# Patient Record
Sex: Female | Born: 1955
Health system: Southern US, Community
[De-identification: ages and names within clinical notes are randomized; demographics above are authoritative.]

## PROBLEM LIST (undated history)

## (undated) DIAGNOSIS — E119 Type 2 diabetes mellitus without complications: Secondary | ICD-10-CM

## (undated) DIAGNOSIS — E079 Disorder of thyroid, unspecified: Secondary | ICD-10-CM

## (undated) DIAGNOSIS — C801 Malignant (primary) neoplasm, unspecified: Secondary | ICD-10-CM

## (undated) DIAGNOSIS — J309 Allergic rhinitis, unspecified: Secondary | ICD-10-CM

## (undated) DIAGNOSIS — K219 Gastro-esophageal reflux disease without esophagitis: Secondary | ICD-10-CM

## (undated) DIAGNOSIS — F419 Anxiety disorder, unspecified: Secondary | ICD-10-CM

## (undated) DIAGNOSIS — M1990 Unspecified osteoarthritis, unspecified site: Secondary | ICD-10-CM

## (undated) DIAGNOSIS — D649 Anemia, unspecified: Secondary | ICD-10-CM

## (undated) DIAGNOSIS — M4300 Spondylolysis, site unspecified: Secondary | ICD-10-CM

## (undated) DIAGNOSIS — E785 Hyperlipidemia, unspecified: Secondary | ICD-10-CM

## (undated) DIAGNOSIS — T7840XA Allergy, unspecified, initial encounter: Secondary | ICD-10-CM

## (undated) DIAGNOSIS — K561 Intussusception: Secondary | ICD-10-CM

## (undated) HISTORY — DX: Type 2 diabetes mellitus without complications: E11.9

## (undated) HISTORY — DX: Anemia, unspecified: D64.9

## (undated) HISTORY — DX: Malignant (primary) neoplasm, unspecified: C80.1

## (undated) HISTORY — DX: Unspecified osteoarthritis, unspecified site: M19.90

## (undated) HISTORY — PX: APPENDECTOMY: SHX54

## (undated) HISTORY — DX: Hyperlipidemia, unspecified: E78.5

## (undated) HISTORY — DX: Gastro-esophageal reflux disease without esophagitis: K21.9

## (undated) HISTORY — DX: Allergy, unspecified, initial encounter: T78.40XA

## (undated) HISTORY — PX: OTHER SURGICAL HISTORY: SHX169

## (undated) HISTORY — DX: Allergic rhinitis, unspecified: J30.9

## (undated) HISTORY — PX: COLONOSCOPY: SHX174

## (undated) HISTORY — DX: Disorder of thyroid, unspecified: E07.9

## (undated) HISTORY — DX: Intussusception: K56.1

## (undated) HISTORY — PX: UPPER GASTROINTESTINAL ENDOSCOPY: SHX188

## (undated) HISTORY — PX: WISDOM TOOTH EXTRACTION: SHX21

## (undated) HISTORY — DX: Spondylolysis, site unspecified: M43.00

## (undated) HISTORY — DX: Anxiety disorder, unspecified: F41.9

---

## 2001-10-18 HISTORY — PX: PERIPHERALLY INSERTED CENTRAL CATHETER INSERTION: SHX2221

## 2001-10-18 HISTORY — PX: SPINAL FUSION: SHX223

## 2014-10-25 ENCOUNTER — Institutional Professional Consult (permissible substitution): Payer: Self-pay | Admitting: Cardiovascular Disease

## 2014-11-22 ENCOUNTER — Encounter: Payer: Self-pay | Admitting: Cardiovascular Disease

## 2014-11-22 ENCOUNTER — Ambulatory Visit (INDEPENDENT_AMBULATORY_CARE_PROVIDER_SITE_OTHER): Payer: 59 | Admitting: Cardiovascular Disease

## 2014-11-22 VITALS — BP 90/84 | HR 60 | Ht 67.0 in | Wt 157.8 lb

## 2014-11-22 DIAGNOSIS — E785 Hyperlipidemia, unspecified: Secondary | ICD-10-CM | POA: Insufficient documentation

## 2014-11-22 DIAGNOSIS — R079 Chest pain, unspecified: Secondary | ICD-10-CM | POA: Insufficient documentation

## 2014-11-22 NOTE — Patient Instructions (Signed)
Your physician recommends that you continue on your current medications as directed. Please refer to the Current Medication list given to you today.  Your physician has requested that you have an exercise tolerance test. For further information please visit [email or web address]. Please also follow instruction sheet, as given.  Your physician recommends that you return for lab work in: the next 6 weeks for fasting cholesterol/liver/basic metabolic panel  Your physician recommends that you schedule a follow-up appointment in: 2-3 months with Dr. Acie Fredrickson

## 2014-11-22 NOTE — Progress Notes (Signed)
Cardiology Office Note   Date:  11/22/2014   ID:  Mary Andrade, DOB 1956/06/03, MRN [id number]  PCP:  No primary care provider on file.  Cardiologist:   Nahser, Wonda Cheng, MD   Chief Complaint  Patient presents with  . Chest Pain   1. Chest pain 2. DM - type 2 3. Hyperlipidemia    History of Present Illness: Mary Andrade is a 59 y.o. female  ( seen with husband [name]) who presents at the request of Dr. Elon Alas  for evaluation of hyperlipidemia and  chest pain  She presents with chest pain - " just a pain " .   Has been going on 2-3 months, has been stable for several months. She exercises regularly, works out with a Physiological scientist twice a week - cardio, strength training  .  She has some dizziness with these work outs but never has any CP or dyspnea.    CP occur With exertion and at rest . The pains can last all day long and others just last a seconds.  The CP has been associated with increased fatigue , no sweats, no dizziness   Also has GERD but this pain feels different  Tries to eat health foods  She was found to have hyperlipidemia at her last office visit and was started on Crestor.  She and her  Has been moved down from [place] last year. She's gained 22 pounds since that time.    Also smokes - 1 ppd.  Has quit once but restarted when she had back back surgery   Past Medical History  Diagnosis Date  . Diabetes mellitus without complication   . Hyperlipidemia     Past Surgical History  Procedure Laterality Date  . Spinal surgeries  2003, 2004, 2005     Current Outpatient Prescriptions  Medication Sig Dispense Refill  . ALPRAZolam (XANAX) 0.25 MG tablet Take 0.25 mg by mouth at bedtime as needed for anxiety.    . CRESTOR 5 MG tablet Take 5 mg by mouth daily.    Marland Kitchen ibuprofen (ADVIL,MOTRIN) 100 MG chewable tablet Chew 200 mg by mouth every 8 (eight) hours as needed for fever or mild pain.     . metFORMIN (GLUCOPHAGE) 500 MG tablet Take 500 mg by  mouth 2 (two) times daily.    Marland Kitchen NEXIUM 40 MG capsule Take 40 mg by mouth daily.    . Probiotic Product (PROBIOTIC DAILY PO) Take by mouth daily.     No current facility-administered medications for this visit.    Allergies:   Morphine and related    Social History:  The patient  reports that she has been smoking.  She does not have any smokeless tobacco history on file. She reports that she does not drink alcohol or use illicit drugs.   Family History:  The patient's family history includes Lung cancer in her father.    ROS:  Please see the history of present illness.    Review of Systems: Constitutional:  denies fever, chills, diaphoresis, appetite change and fatigue.  HEENT: denies photophobia, eye pain, redness, hearing loss, ear pain, congestion, sore throat, rhinorrhea, sneezing, neck pain, neck stiffness and tinnitus.  Respiratory: denies SOB, DOE, cough, chest tightness, and wheezing.  Cardiovascular: denies chest pain, palpitations and leg swelling.  Gastrointestinal: denies nausea, vomiting, abdominal pain, diarrhea, constipation, blood in stool.  Genitourinary: denies dysuria, urgency, frequency, hematuria, flank pain and difficulty urinating.  Musculoskeletal: denies  myalgias, back pain, joint  swelling, arthralgias and gait problem.   Skin: denies pallor, rash and wound.  Neurological: denies dizziness, seizures, syncope, weakness, light-headedness, numbness and headaches.   Hematological: denies adenopathy, easy bruising, personal or family bleeding history.  Psychiatric/ Behavioral: denies suicidal ideation, mood changes, confusion, nervousness, sleep disturbance and agitation.       All other systems are reviewed and negative.    PHYSICAL EXAM: VS:  BP 90/84 mmHg  Pulse 60  Ht 5\' 7"  (1.702 m)  Wt 157 lb 12.8 oz (71.578 kg)  BMI 24.71 kg/m2 , BMI Body mass index is 24.71 kg/(m^2). GEN: Well nourished, well developed, in no acute distress HEENT: normal Neck:  no JVD, carotid bruits, or masses Cardiac: RRR; no murmurs, rubs, or gallops,no edema  Respiratory:  clear to auscultation bilaterally, normal work of breathing GI: soft, nontender, nondistended, + BS MS: no deformity or atrophy Skin: warm and dry, no rash Neuro:  Strength and sensation are intact Psych: normal   EKG:  EKG is not ordered today. ECG from 10/14/14 - sinus bradycardia at 53.    Recent Labs: No results found for requested labs within last 365 days.    Lipid Panel No results found for: CHOL, TRIG, HDL, CHOLHDL, VLDL, LDLCALC, LDLDIRECT    Wt Readings from Last 3 Encounters:  11/22/14 157 lb 12.8 oz (71.578 kg)      Other studies Reviewed: Additional studies/ records that were reviewed today include: . Review of the above records demonstrates:    ASSESSMENT AND PLAN:  1.   Hyperlipidemia :   She had her recent lipids drawn at her medical doctor. Her total cholesterol was 231.   Her triglycerides are 34. A chill 71. Her LDL is 143. She was started on Crestor 20 mg a day. We'll recheck lipids in the proximal he 6 weeks.  2.  Chest discomfort : patient presents with some atypical chest pains she does have a history of cigarette smoking, hypertension, and hyperlipidemia. She works out regularly it typically does not pain but at other times she'll have very severe pains.   Resting EKG is normal. I think that she would benefit from a regular GXT stress test.  I'll see her back in the office in several months for follow-up visit.   Current medicines are reviewed at length with the patient today.  The patient does not have concerns regarding medicines.  The following changes have been made:  no change   Disposition:   FU with me in 2-3 months      Signed, Nahser, Wonda Cheng, MD  11/22/2014 4:18 PM    [facility] [place], [place], [place]  [address] Phone: [phone]; Fax: [phone]

## 2014-12-10 ENCOUNTER — Telehealth (HOSPITAL_COMMUNITY): Payer: Self-pay

## 2014-12-10 NOTE — Telephone Encounter (Signed)
Encounter complete. 

## 2014-12-12 ENCOUNTER — Ambulatory Visit (HOSPITAL_COMMUNITY)
Admission: RE | Admit: 2014-12-12 | Discharge: 2014-12-12 | Disposition: A | Discharge status: Home / Self Care | Payer: 59 | Source: Ambulatory Visit | Attending: Cardiovascular Disease | Admitting: Cardiovascular Disease

## 2014-12-12 DIAGNOSIS — R079 Chest pain, unspecified: Secondary | ICD-10-CM | POA: Diagnosis present

## 2014-12-12 NOTE — Procedures (Signed)
Exercise Treadmill Test  Pre-Exercise Testing Evaluation  NSR   Test  Exercise Tolerance Test Ordering MD: Mertie Moores, MD    Unique Test No: 1  Treadmill:  1  Indication for ETT: chest pain - rule out ischemia  Contraindication to ETT: No   Stress Modality: exercise - treadmill  Cardiac Imaging Performed: non   Protocol: standard Bruce - maximal  Max BP:  138/102  Max MPHR (bpm):  164 85% MPR (bpm):  139  MPHR obtained (bpm):  160 % MPHR obtained:  97  Reached 85% MPHR (min:sec):  4:35 Total Exercise Time (min-sec):  9  Workload in METS:  10.1 Borg Scale: 16  Reason ETT Terminated:  SOB and Back Pain    ST Segment Analysis At Rest: normal ST segments - no evidence of significant ST depression With Exercise: no evidence of significant ST depression  Other Information Arrhythmia:  No Angina during ETT:  absent (0) Quality of ETT:  diagnostic  ETT Interpretation:  normal - no evidence of ischemia by ST analysis  Comments: Increase in diastolic BP during exercise. Good exercise tolerance.  [name], MD, [facility] [phone] office [phone] pager

## 2015-01-03 ENCOUNTER — Other Ambulatory Visit: Payer: 59

## 2015-01-24 ENCOUNTER — Other Ambulatory Visit: Payer: Self-pay | Admitting: Family Medicine

## 2015-01-24 ENCOUNTER — Ambulatory Visit
Admission: RE | Admit: 2015-01-24 | Discharge: 2015-01-24 | Disposition: A | Discharge status: Home / Self Care | Payer: 59 | Source: Ambulatory Visit | Attending: Family Medicine | Admitting: Family Medicine

## 2015-01-24 DIAGNOSIS — R059 Cough, unspecified: Secondary | ICD-10-CM

## 2015-01-24 DIAGNOSIS — R05 Cough: Secondary | ICD-10-CM

## 2015-02-19 ENCOUNTER — Telehealth: Payer: Self-pay | Admitting: Cardiovascular Disease

## 2015-02-19 NOTE — Telephone Encounter (Signed)
Spoke with patient who states she never received a call regarding her GXT results.  I reviewed these with the patient and apologized that she never got an official call from [place] (she was told by the technician at the time of the test that she did well and that the official report would be called to her later).  Patient reports that she is doing well; states she thinks the chest pain is related to stress and acid reflux.  She was advised to f/u with Dr. Acie Fredrickson in 2-3 months when she was initially seen in February 2016.  I advised patient to call back with questions or concerns and for future follow-up as needed.  Patient verbalized understanding and agreement.

## 2015-02-19 NOTE — Telephone Encounter (Signed)
NEw Message  Pt requested to speak w/ Rn about stress test results. Please cal back and discuss.

## 2015-02-20 ENCOUNTER — Ambulatory Visit: Payer: 59 | Admitting: Cardiovascular Disease

## 2015-02-25 ENCOUNTER — Other Ambulatory Visit: Payer: Self-pay | Admitting: Family Medicine

## 2015-02-25 ENCOUNTER — Ambulatory Visit
Admission: RE | Admit: 2015-02-25 | Discharge: 2015-02-25 | Disposition: A | Discharge status: Home / Self Care | Payer: 59 | Source: Ambulatory Visit | Attending: Family Medicine | Admitting: Family Medicine

## 2015-02-25 DIAGNOSIS — M25512 Pain in left shoulder: Secondary | ICD-10-CM

## 2015-09-07 NOTE — Progress Notes (Signed)
Cardiology Office Note   Date:  09/07/2015   ID:  [name], DOB 04-Apr-1956, MRN [id number]  PCP:  No primary care provider on file.  Cardiologist:   Harim Bi, Wonda Cheng, MD   Chief Complaint  Patient presents with  . Chest Pain   1. Chest pain 2. DM - type 2 3. Hyperlipidemia    History of Present Illness: Mary Andrade is a 59 y.o. female  ( seen with husband Mary Andrade) who presents at the request of Dr. Elon Alas  for evaluation of hyperlipidemia and  chest pain  She presents with chest pain - " just a pain " .   Has been going on 2-3 months, has been stable for several months. She exercises regularly, works out with a Physiological scientist twice a week - cardio, strength training  .  She has some dizziness with these work outs but never has any CP or dyspnea.    CP occur With exertion and at rest . The pains can last all day long and others just last a seconds.  The CP has been associated with increased fatigue , no sweats, no dizziness   Also has GERD but this pain feels different  Tries to eat health foods  She was found to have hyperlipidemia at her last office visit and was started on Crestor.  She and her  Has been moved down from [place] last year. She's gained 22 pounds since that time.    Also smokes - 1 ppd.  Has quit once but restarted when she had back back surgery  Nov. 20, 2016:  Mary Andrade had a stress test after our initial visit   standard Bruce - maximal Max BP: 138/102  Max MPHR (bpm): 164 85% MPR (bpm): 139  MPHR obtained (bpm): 160 % MPHR obtained: 97  Reached 85% MPHR (min:sec): 4:35 Total Exercise Time (min-sec):  9  Workload in METS: 10.1 Borg Scale: 16   At Rest: normal ST segments - no evidence of significant ST  depression With Exercise: no evidence of significant ST depression   ETT Interpretation: normal - no evidence of ischemia   Nov. 21, 2016: Has done well. Had some unusual chest pains  While visiting [place]  looked ok ( according to patient )  Has now been diagnosed with GERD  Has been cutting back on her smoking .   Still has occasional episodes of CP. Hurts constantly ( in fact hurts right now)     Past Medical History  Diagnosis Date  . Diabetes mellitus without complication   . Hyperlipidemia     Past Surgical History  Procedure Laterality Date  . Spinal surgeries  2003, 2004, 2005     Current Outpatient Prescriptions  Medication Sig Dispense Refill  . ALPRAZolam (XANAX) 0.25 MG tablet Take 0.25 mg by mouth at bedtime as needed for anxiety.    . CRESTOR 5 MG tablet Take 5 mg by mouth daily.    Marland Kitchen ibuprofen (ADVIL,MOTRIN) 100 MG chewable tablet Chew 200 mg by mouth every 8 (eight) hours as needed for fever or mild pain.     . metFORMIN (GLUCOPHAGE) 500 MG tablet Take 500 mg by mouth 2 (two) times daily.    Marland Kitchen NEXIUM 40 MG capsule Take 40 mg by mouth daily.    . Probiotic Product (PROBIOTIC DAILY PO) Take by mouth daily.     No current facility-administered medications for this visit.    Allergies:   Morphine and related  Social History:  The patient  reports that she has been smoking.  She does not have any smokeless tobacco history on file. She reports that she does not drink alcohol or use illicit drugs.   Family History:  The patient's family history includes Lung cancer in her father.    ROS:  Please see the history of present illness.    Review of Systems: Constitutional:  denies fever, chills, diaphoresis, appetite change and fatigue.  HEENT: denies photophobia, eye pain, redness, hearing loss, ear pain, congestion, sore throat, rhinorrhea, sneezing, neck pain, neck stiffness and tinnitus.  Respiratory: denies SOB, DOE, cough, chest tightness, and wheezing.  Cardiovascular: denies chest pain, palpitations and leg swelling.  Gastrointestinal: denies nausea, vomiting, abdominal pain, diarrhea, constipation, blood in stool.  Genitourinary: denies dysuria, urgency,  frequency, hematuria, flank pain and difficulty urinating.  Musculoskeletal: denies  myalgias, back pain, joint swelling, arthralgias and gait problem.   Skin: denies pallor, rash and wound.  Neurological: denies dizziness, seizures, syncope, weakness, light-headedness, numbness and headaches.   Hematological: denies adenopathy, easy bruising, personal or family bleeding history.  Psychiatric/ Behavioral: denies suicidal ideation, mood changes, confusion, nervousness, sleep disturbance and agitation.       All other systems are reviewed and negative.    PHYSICAL EXAM: VS:  There were no vitals taken for this visit. , BMI There is no weight on file to calculate BMI. GEN: Well nourished, well developed, in no acute distress HEENT: normal Neck: no JVD, carotid bruits, or masses Cardiac: RRR; no murmurs, rubs, or gallops,no edema  Respiratory:  clear to auscultation bilaterally, normal work of breathing GI: soft, nontender, nondistended, + BS MS: no deformity or atrophy Skin: warm and dry, no rash Neuro:  Strength and sensation are intact Psych: normal   EKG:  EKG is ordered today. ECG from today shows NSR at 62.  Low voltage.    Recent Labs: No results found for requested labs within last 365 days.    Lipid Panel No results found for: CHOL, TRIG, HDL, CHOLHDL, VLDL, LDLCALC, LDLDIRECT    Wt Readings from Last 3 Encounters:  11/22/14 157 lb 12.8 oz (71.578 kg)      Other studies Reviewed: Additional studies/ records that were reviewed today include: . Review of the above records demonstrates:    ASSESSMENT AND PLAN:  1.   Hyperlipidemia :   She had her recent lipids drawn at her medical doctor. Her total cholesterol was 231.   Her triglycerides are 34. A chill 71. Her LDL is 143. She was started on Crestor 20 mg a day. We'll recheck lipids in the proximal he 6 weeks.  2.  Chest discomfort : patient presents with some atypical chest pains she does have a history of  cigarette smoking, hypertension, and hyperlipidemia. She works out regularly it typically does not pain but at other times she'll have very severe pains.   Resting EKG is normal. I think that she would benefit from a regular GXT stress test.  I'll see her back in the office in several months for follow-up visit.   Current medicines are reviewed at length with the patient today.  The patient does not have concerns regarding medicines.  The following changes have been made:  no change   Disposition:   FU with me in 2-3 months      Signed, Brysun Eschmann, Wonda Cheng, MD  09/07/2015 4:47 PM    [address], [place],   [address]  Phone: [phone]; Fax: [phone]

## 2015-09-08 ENCOUNTER — Encounter: Payer: Self-pay | Admitting: Nurse Practitioner

## 2015-09-08 ENCOUNTER — Ambulatory Visit (INDEPENDENT_AMBULATORY_CARE_PROVIDER_SITE_OTHER): Payer: 59 | Admitting: Cardiovascular Disease

## 2015-09-08 ENCOUNTER — Encounter: Payer: Self-pay | Admitting: Cardiovascular Disease

## 2015-09-08 VITALS — BP 98/80 | HR 62 | Ht 67.0 in | Wt 157.8 lb

## 2015-09-08 DIAGNOSIS — E785 Hyperlipidemia, unspecified: Secondary | ICD-10-CM | POA: Diagnosis not present

## 2015-09-08 DIAGNOSIS — M940 Chondrocostal junction syndrome [Tietze]: Secondary | ICD-10-CM | POA: Diagnosis not present

## 2015-09-08 NOTE — Patient Instructions (Signed)

## 2015-09-13 ENCOUNTER — Emergency Department (HOSPITAL_COMMUNITY): Payer: 59

## 2015-09-13 ENCOUNTER — Encounter (HOSPITAL_COMMUNITY): Payer: Self-pay | Admitting: Nurse Practitioner

## 2015-09-13 ENCOUNTER — Emergency Department (HOSPITAL_COMMUNITY)
Admission: EM | Admit: 2015-09-13 | Discharge: 2015-09-14 | Disposition: A | Discharge status: Home / Self Care | Payer: 59 | Attending: Emergency Medicine | Admitting: Emergency Medicine

## 2015-09-13 DIAGNOSIS — R1033 Periumbilical pain: Secondary | ICD-10-CM

## 2015-09-13 DIAGNOSIS — Z880 Allergy status to penicillin: Secondary | ICD-10-CM | POA: Diagnosis not present

## 2015-09-13 DIAGNOSIS — E785 Hyperlipidemia, unspecified: Secondary | ICD-10-CM | POA: Diagnosis not present

## 2015-09-13 DIAGNOSIS — R109 Unspecified abdominal pain: Secondary | ICD-10-CM | POA: Diagnosis present

## 2015-09-13 DIAGNOSIS — F17210 Nicotine dependence, cigarettes, uncomplicated: Secondary | ICD-10-CM | POA: Insufficient documentation

## 2015-09-13 DIAGNOSIS — Z79899 Other long term (current) drug therapy: Secondary | ICD-10-CM | POA: Insufficient documentation

## 2015-09-13 DIAGNOSIS — E119 Type 2 diabetes mellitus without complications: Secondary | ICD-10-CM | POA: Diagnosis not present

## 2015-09-13 LAB — COMPREHENSIVE METABOLIC PANEL
ALBUMIN: 4 g/dL (ref 3.5–5.0)
ALK PHOS: 51 U/L (ref 38–126)
ALT: 15 U/L (ref 14–54)
AST: 25 U/L (ref 15–41)
Anion gap: 8 (ref 5–15)
BILIRUBIN TOTAL: 0.5 mg/dL (ref 0.3–1.2)
CO2: 26 mmol/L (ref 22–32)
Calcium: 9.6 mg/dL (ref 8.9–10.3)
Chloride: 108 mmol/L (ref 101–111)
Creatinine, Ser: 0.96 mg/dL (ref 0.44–1.00)
GFR calc Af Amer: >60 mL/min (ref >60)
GFR calc non Af Amer: >60 mL/min (ref >60)
GLUCOSE: 105 mg/dL — AB (ref 65–99)
POTASSIUM: 3.7 mmol/L (ref 3.5–5.1)
Sodium: 142 mmol/L (ref 135–145)
TOTAL PROTEIN: 6.7 g/dL (ref 6.5–8.1)

## 2015-09-13 LAB — URINALYSIS, ROUTINE W REFLEX MICROSCOPIC
BILIRUBIN URINE: NEGATIVE
GLUCOSE, UA: NEGATIVE mg/dL
KETONES UR: NEGATIVE mg/dL
NITRITE: NEGATIVE
PH: 5.5 (ref 5.0–8.0)
Protein, ur: NEGATIVE mg/dL
Specific Gravity, Urine: 1.004 — ABNORMAL LOW (ref 1.005–1.030)

## 2015-09-13 LAB — CBC
HEMATOCRIT: 41.9 % (ref 36.0–46.0)
Hemoglobin: 14.2 g/dL (ref 12.0–15.0)
MCH: 30.5 pg (ref 26.0–34.0)
MCHC: 33.9 g/dL (ref 30.0–36.0)
MCV: 89.9 fL (ref 78.0–100.0)
Platelets: 225 10*3/uL (ref 150–400)
RBC: 4.66 MIL/uL (ref 3.87–5.11)
RDW: 13.1 % (ref 11.5–15.5)
WBC: 6 10*3/uL (ref 4.0–10.5)

## 2015-09-13 LAB — URINE MICROSCOPIC-ADD ON

## 2015-09-13 MED ORDER — OXYCODONE-ACETAMINOPHEN 5-325 MG PO TABS: 1.0000 | ORAL_TABLET | ORAL | Status: DC | PRN

## 2015-09-13 MED ORDER — ONDANSETRON HCL 4 MG PO TABS: 4.0000 mg | ORAL_TABLET | Freq: Three times a day (TID) | ORAL | Status: DC | PRN

## 2015-09-13 MED ORDER — SODIUM CHLORIDE 0.9 % IV BOLUS (SEPSIS)
1000.0000 mL | Freq: Once | INTRAVENOUS | Status: AC
  Administered 2015-09-13: 1000 mL via INTRAVENOUS

## 2015-09-13 MED ORDER — FENTANYL CITRATE (PF) 100 MCG/2ML IJ SOLN: 50.0000 ug | INTRAMUSCULAR | Status: DC | PRN

## 2015-09-13 MED ORDER — IOHEXOL 300 MG/ML  SOLN
100.0000 mL | Freq: Once | INTRAMUSCULAR | Status: AC | PRN
  Administered 2015-09-13: 100 mL via INTRAVENOUS

## 2015-09-13 MED ORDER — ONDANSETRON HCL 4 MG/2ML IJ SOLN
4.0000 mg | Freq: Once | INTRAMUSCULAR | Status: AC
Start: 2015-09-13 — End: 2015-09-13
  Administered 2015-09-13: 4 mg via INTRAVENOUS
  Filled 2015-09-13: qty 2

## 2015-09-13 MED ORDER — LORAZEPAM 2 MG/ML IJ SOLN
1.0000 mg | Freq: Once | INTRAMUSCULAR | Status: AC
Start: 2015-09-13 — End: 2015-09-13
  Administered 2015-09-13: 1 mg via INTRAVENOUS
  Filled 2015-09-13: qty 1

## 2015-09-13 MED ORDER — DIPHENHYDRAMINE HCL 50 MG/ML IJ SOLN
12.5000 mg | Freq: Once | INTRAMUSCULAR | Status: AC
Start: 2015-09-13 — End: 2015-09-13
  Administered 2015-09-13: 12.5 mg via INTRAVENOUS

## 2015-09-13 MED ORDER — DIPHENHYDRAMINE HCL 50 MG/ML IJ SOLN
25.0000 mg | Freq: Once | INTRAMUSCULAR | Status: DC
Start: 2015-09-13 — End: 2015-09-13
  Filled 2015-09-13: qty 1

## 2015-09-13 MED ORDER — IOHEXOL 300 MG/ML  SOLN
50.0000 mL | INTRAMUSCULAR | Status: AC
  Administered 2015-09-13: 25 mL via ORAL

## 2015-09-13 NOTE — ED Notes (Signed)
Patient transported to CT 

## 2015-09-13 NOTE — ED Provider Notes (Signed)
CSN: [id number]     Arrival date & time 09/13/15  1716 History   First MD Initiated Contact with Patient 09/13/15 1837     Chief Complaint  Patient presents with  . Abdominal Pain     (Consider location/radiation/quality/duration/timing/severity/associated sxs/prior Treatment) HPI   Mary Andrade is a(n) 59 y.o. female who presents with CC of abdominal pain. She states that her GI specialist recently changed her GERD medication and she became constipated. She took milk of magnesia and made several liquid stools. Over the past 4 days she has had worsening R sided abdominal pain , anorexia, yellow watery stools, and nausea. She denies any melena or hematochezia. She has not been running fevers. He has been nauseous without vomiting. She has no previous history of abdominal surgeries. She denies urinary or vaginal symptoms.  Past Medical History  Diagnosis Date  . Diabetes mellitus without complication ([place])   . Hyperlipidemia    Past Surgical History  Procedure Laterality Date  . Spinal surgeries  2003, 2004, 2005   Family History  Problem Relation Age of Onset  . Lung cancer Father    Social History  Substance Use Topics  . Smoking status: Current Every Day Smoker    Types: Cigarettes  . Smokeless tobacco: None  . Alcohol Use: No   OB History    No data available     Review of Systems  Ten systems reviewed and are negative for acute change, except as noted in the HPI.    Allergies  Contrast media; Topamax; Amoxicillin-pot clavulanate; Barium-containing compounds; Gadolinium derivatives; Pantoprazole; Pregabalin; and Morphine and related  Home Medications   Prior to Admission medications   Medication Sig Start Date End Date Taking? Authorizing Provider  ALPRAZolam (XANAX) 0.25 MG tablet Take 0.25 mg by mouth 3 (three) times daily as needed for anxiety or sleep.    Yes Historical Provider, MD  omeprazole (PRILOSEC) 20 MG capsule Take 40 mg by mouth daily.   Yes  Historical Provider, MD  Probiotic Product (PROBIOTIC DAILY PO) Take 1 tablet by mouth daily.    Yes Historical Provider, MD  DEXILANT 60 MG capsule Take 60 mg by mouth daily. 09/05/15   Historical Provider, MD  ibuprofen (ADVIL,MOTRIN) 100 MG chewable tablet Chew 200 mg by mouth every 8 (eight) hours as needed for fever or mild pain.     Historical Provider, MD  omeprazole (PRILOSEC) 20 MG capsule Take 40 mg by mouth daily. 09/03/15   Historical Provider, MD   BP 116/73 mmHg  Pulse 63  Temp(Src) 97.9 F (36.6 C) (Oral)  Resp 22  Ht '5\' 7"'$  (1.702 m)  Wt 68.947 kg  BMI 23.80 kg/m2  SpO2 97% Physical Exam Physical Exam  Nursing note and vitals reviewed. Constitutional: She is oriented to person, place, and time. She appears well-developed and well-nourished. No distress.  HENT:  Head: Normocephalic and atraumatic.  Eyes: Conjunctivae normal and EOM are normal. Pupils are equal, round, and reactive to light. No scleral icterus.  Neck: Normal range of motion.  Cardiovascular: Normal rate, regular rhythm and normal heart sounds.  Exam reveals no gallop and no friction rub.   No murmur heard. Pulmonary/Chest: Effort normal and breath sounds normal. No respiratory distress.  Abdominal: Soft. Bowel sounds are normal. Tender to palpation around the umbilicus.  Neurological: She is alert and oriented to person, place, and time.  Skin: Skin is warm and dry. She is not diaphoretic.    ED Course  Procedures (including critical care  time) Labs Review Labs Reviewed  COMPREHENSIVE METABOLIC PANEL - Abnormal; Notable for the following:    Glucose, Bld 105 (*)    BUN <5 (*)    All other components within normal limits  URINALYSIS, ROUTINE W REFLEX MICROSCOPIC (NOT AT [facility]) - Abnormal; Notable for the following:    Specific Gravity, Urine 1.004 (*)    Hgb urine dipstick TRACE (*)    Leukocytes, UA SMALL (*)    All other components within normal limits  URINE MICROSCOPIC-ADD ON - Abnormal;  Notable for the following:    Squamous Epithelial / LPF 0-5 (*)    Bacteria, UA RARE (*)    All other components within normal limits  URINE CULTURE  CBC    Imaging Review No results found. I have personally reviewed and evaluated these images and lab results as part of my medical decision-making.   EKG Interpretation None      MDM   Final diagnoses:  Periumbilical abdominal pain    Patients labs are reassuring without acute abnormality. No evidence of urinary tract infection. Patient's CT scan is without acute abnormality. There is no acute finding to account for the patient's abdominal pain. I suspect the patient may have some gastritis or irritation secondary to her laxative use. There is no stool burden present on the images. The patient has had no active vomiting. Her pain is improved. She appears safe for discharge at this time.     Margarita Mail, PA-C 09/15/15 1658  Merrily Pew, MD 09/16/15 1254

## 2015-09-13 NOTE — ED Notes (Signed)
She c/o 4 day history R side/abd pain, constant since onset. shes had decreased appetite and recent constipation. She took laxative with bowel movement several days ago but no pain relief. She reports several episodes of yellow watery stools per day over past few days

## 2015-09-13 NOTE — Discharge Instructions (Signed)
Abdominal (belly) pain can be caused by many things. Your caregiver performed an examination and possibly ordered blood/urine tests and imaging (CT scan, x-rays, ultrasound). Many cases can be observed and treated at home after initial evaluation in the emergency department. Even though you are being discharged home, abdominal pain can be unpredictable. Therefore, you need a repeated exam if your pain does not resolve, returns, or worsens. Most patients with abdominal pain don't have to be admitted to the hospital or have surgery, but serious problems like appendicitis and gallbladder attacks can start out as nonspecific pain. Many abdominal conditions cannot be diagnosed in one visit, so follow-up evaluations are very important. SEEK IMMEDIATE MEDICAL ATTENTION IF: The pain does not go away or becomes severe.  A temperature above 101 develops.  Repeated vomiting occurs (multiple episodes).  The pain becomes localized to portions of the abdomen. The right side could possibly be appendicitis. In an adult, the left lower portion of the abdomen could be colitis or diverticulitis.  Blood is being passed in stools or vomit (bright red or black tarry stools).  Return also if you develop chest pain, difficulty breathing, dizziness or fainting, or become confused, poorly responsive, or inconsolable (young children).  Abdominal Pain, Adult Many things can cause abdominal pain. Usually, abdominal pain is not caused by a disease and will improve without treatment. It can often be observed and treated at home. Your health care provider will do a physical exam and possibly order blood tests and X-rays to help determine the seriousness of your pain. However, in many cases, more time must pass before a clear cause of the pain can be found. Before that point, your health care provider may not know if you need more testing or further treatment. HOME CARE INSTRUCTIONS Monitor your abdominal pain for any changes. The  following actions may help to alleviate any discomfort you are experiencing:  Only take over-the-counter or prescription medicines as directed by your health care provider.  Do not take laxatives unless directed to do so by your health care provider.  Try a clear liquid diet (broth, tea, or water) as directed by your health care provider. Slowly move to a bland diet as tolerated. SEEK MEDICAL CARE IF:  You have unexplained abdominal pain.  You have abdominal pain associated with nausea or diarrhea.  You have pain when you urinate or have a bowel movement.  You experience abdominal pain that wakes you in the night.  You have abdominal pain that is worsened or improved by eating food.  You have abdominal pain that is worsened with eating fatty foods.  You have a fever. SEEK IMMEDIATE MEDICAL CARE IF:  Your pain does not go away within 2 hours.  You keep throwing up (vomiting).  Your pain is felt only in portions of the abdomen, such as the right side or the left lower portion of the abdomen.  You pass bloody or black tarry stools. MAKE SURE YOU:  Understand these instructions.  Will watch your condition.  Will get help right away if you are not doing well or get worse.   This information is not intended to replace advice given to you by your health care provider. Make sure you discuss any questions you have with your health care provider.   Document Released: 07/14/2005 Document Revised: 06/25/2015 Document Reviewed: 06/13/2013 Elsevier Interactive Patient Education [employer].

## 2015-09-15 LAB — URINE CULTURE

## 2015-11-04 DIAGNOSIS — M542 Cervicalgia: Secondary | ICD-10-CM | POA: Diagnosis not present

## 2015-11-04 DIAGNOSIS — G518 Other disorders of facial nerve: Secondary | ICD-10-CM | POA: Diagnosis not present

## 2015-11-04 DIAGNOSIS — R51 Headache: Secondary | ICD-10-CM | POA: Diagnosis not present

## 2015-11-04 DIAGNOSIS — M791 Myalgia: Secondary | ICD-10-CM | POA: Diagnosis not present

## 2015-11-18 DIAGNOSIS — M7062 Trochanteric bursitis, left hip: Secondary | ICD-10-CM | POA: Diagnosis not present

## 2015-11-27 DIAGNOSIS — G518 Other disorders of facial nerve: Secondary | ICD-10-CM | POA: Diagnosis not present

## 2015-11-27 DIAGNOSIS — M542 Cervicalgia: Secondary | ICD-10-CM | POA: Diagnosis not present

## 2015-11-27 DIAGNOSIS — M791 Myalgia: Secondary | ICD-10-CM | POA: Diagnosis not present

## 2015-11-27 DIAGNOSIS — R51 Headache: Secondary | ICD-10-CM | POA: Diagnosis not present

## 2015-12-10 ENCOUNTER — Other Ambulatory Visit: Payer: Self-pay | Admitting: Obstetrics and Gynecology

## 2015-12-10 ENCOUNTER — Other Ambulatory Visit: Payer: Self-pay

## 2015-12-10 ENCOUNTER — Other Ambulatory Visit (HOSPITAL_COMMUNITY)
Admission: RE | Admit: 2015-12-10 | Discharge: 2015-12-10 | Disposition: A | Discharge status: Home / Self Care | Payer: Medicare Other | Source: Ambulatory Visit | Attending: Obstetrics and Gynecology | Admitting: Obstetrics and Gynecology

## 2015-12-10 DIAGNOSIS — Z1151 Encounter for screening for human papillomavirus (HPV): Secondary | ICD-10-CM | POA: Insufficient documentation

## 2015-12-10 DIAGNOSIS — Z01411 Encounter for gynecological examination (general) (routine) with abnormal findings: Secondary | ICD-10-CM | POA: Diagnosis not present

## 2015-12-10 DIAGNOSIS — Z1231 Encounter for screening mammogram for malignant neoplasm of breast: Secondary | ICD-10-CM

## 2015-12-10 DIAGNOSIS — Z01419 Encounter for gynecological examination (general) (routine) without abnormal findings: Secondary | ICD-10-CM | POA: Diagnosis not present

## 2015-12-10 DIAGNOSIS — M810 Age-related osteoporosis without current pathological fracture: Secondary | ICD-10-CM | POA: Diagnosis not present

## 2015-12-10 DIAGNOSIS — M8589 Other specified disorders of bone density and structure, multiple sites: Secondary | ICD-10-CM | POA: Diagnosis not present

## 2015-12-12 DIAGNOSIS — R51 Headache: Secondary | ICD-10-CM | POA: Diagnosis not present

## 2015-12-12 DIAGNOSIS — M542 Cervicalgia: Secondary | ICD-10-CM | POA: Diagnosis not present

## 2015-12-12 DIAGNOSIS — M791 Myalgia: Secondary | ICD-10-CM | POA: Diagnosis not present

## 2015-12-12 DIAGNOSIS — G518 Other disorders of facial nerve: Secondary | ICD-10-CM | POA: Diagnosis not present

## 2015-12-12 LAB — CYTOLOGY - PAP

## 2015-12-22 DIAGNOSIS — M7062 Trochanteric bursitis, left hip: Secondary | ICD-10-CM | POA: Diagnosis not present

## 2015-12-24 DIAGNOSIS — Z720 Tobacco use: Secondary | ICD-10-CM | POA: Diagnosis not present

## 2015-12-24 DIAGNOSIS — K219 Gastro-esophageal reflux disease without esophagitis: Secondary | ICD-10-CM | POA: Diagnosis not present

## 2015-12-24 DIAGNOSIS — F411 Generalized anxiety disorder: Secondary | ICD-10-CM | POA: Diagnosis not present

## 2015-12-25 DIAGNOSIS — R51 Headache: Secondary | ICD-10-CM | POA: Diagnosis not present

## 2015-12-25 DIAGNOSIS — G518 Other disorders of facial nerve: Secondary | ICD-10-CM | POA: Diagnosis not present

## 2015-12-25 DIAGNOSIS — M542 Cervicalgia: Secondary | ICD-10-CM | POA: Diagnosis not present

## 2015-12-25 DIAGNOSIS — M791 Myalgia: Secondary | ICD-10-CM | POA: Diagnosis not present

## 2016-01-01 DIAGNOSIS — M7062 Trochanteric bursitis, left hip: Secondary | ICD-10-CM | POA: Diagnosis not present

## 2016-01-20 ENCOUNTER — Ambulatory Visit: Payer: Medicare Other

## 2016-01-27 DIAGNOSIS — M7062 Trochanteric bursitis, left hip: Secondary | ICD-10-CM | POA: Diagnosis not present

## 2016-02-02 DIAGNOSIS — M7062 Trochanteric bursitis, left hip: Secondary | ICD-10-CM | POA: Diagnosis not present

## 2016-02-03 ENCOUNTER — Ambulatory Visit
Admission: RE | Admit: 2016-02-03 | Discharge: 2016-02-03 | Disposition: A | Discharge status: Home / Self Care | Payer: Medicare Other | Source: Ambulatory Visit

## 2016-02-03 DIAGNOSIS — Z1231 Encounter for screening mammogram for malignant neoplasm of breast: Secondary | ICD-10-CM | POA: Diagnosis not present

## 2016-02-03 DIAGNOSIS — M859 Disorder of bone density and structure, unspecified: Secondary | ICD-10-CM | POA: Diagnosis not present

## 2016-02-03 DIAGNOSIS — M8589 Other specified disorders of bone density and structure, multiple sites: Secondary | ICD-10-CM | POA: Diagnosis not present

## 2016-02-13 DIAGNOSIS — L659 Nonscarring hair loss, unspecified: Secondary | ICD-10-CM | POA: Diagnosis not present

## 2016-02-13 DIAGNOSIS — R109 Unspecified abdominal pain: Secondary | ICD-10-CM | POA: Diagnosis not present

## 2016-02-13 DIAGNOSIS — Z79899 Other long term (current) drug therapy: Secondary | ICD-10-CM | POA: Diagnosis not present

## 2016-02-13 DIAGNOSIS — K1379 Other lesions of oral mucosa: Secondary | ICD-10-CM | POA: Diagnosis not present

## 2016-02-13 DIAGNOSIS — R232 Flushing: Secondary | ICD-10-CM | POA: Diagnosis not present

## 2016-02-24 ENCOUNTER — Encounter (HOSPITAL_BASED_OUTPATIENT_CLINIC_OR_DEPARTMENT_OTHER): Payer: Self-pay | Admitting: *Deleted

## 2016-02-24 ENCOUNTER — Inpatient Hospital Stay (HOSPITAL_BASED_OUTPATIENT_CLINIC_OR_DEPARTMENT_OTHER)
Admission: EM | Admit: 2016-02-24 | Discharge: 2016-03-02 | DRG: 330 | Disposition: A | Discharge status: Home / Self Care | Payer: Medicare Other | Attending: Surgery | Admitting: Surgery

## 2016-02-24 ENCOUNTER — Emergency Department (HOSPITAL_BASED_OUTPATIENT_CLINIC_OR_DEPARTMENT_OTHER): Payer: Medicare Other

## 2016-02-24 DIAGNOSIS — F17210 Nicotine dependence, cigarettes, uncomplicated: Secondary | ICD-10-CM | POA: Diagnosis present

## 2016-02-24 DIAGNOSIS — R110 Nausea: Secondary | ICD-10-CM | POA: Diagnosis not present

## 2016-02-24 DIAGNOSIS — E1165 Type 2 diabetes mellitus with hyperglycemia: Secondary | ICD-10-CM | POA: Diagnosis not present

## 2016-02-24 DIAGNOSIS — Z801 Family history of malignant neoplasm of trachea, bronchus and lung: Secondary | ICD-10-CM

## 2016-02-24 DIAGNOSIS — K567 Ileus, unspecified: Secondary | ICD-10-CM | POA: Diagnosis not present

## 2016-02-24 DIAGNOSIS — Z01818 Encounter for other preprocedural examination: Secondary | ICD-10-CM

## 2016-02-24 DIAGNOSIS — E119 Type 2 diabetes mellitus without complications: Secondary | ICD-10-CM | POA: Diagnosis not present

## 2016-02-24 DIAGNOSIS — R1011 Right upper quadrant pain: Secondary | ICD-10-CM | POA: Diagnosis not present

## 2016-02-24 DIAGNOSIS — E785 Hyperlipidemia, unspecified: Secondary | ICD-10-CM | POA: Diagnosis present

## 2016-02-24 DIAGNOSIS — K561 Intussusception: Secondary | ICD-10-CM | POA: Diagnosis present

## 2016-02-24 DIAGNOSIS — C180 Malignant neoplasm of cecum: Secondary | ICD-10-CM | POA: Diagnosis present

## 2016-02-24 DIAGNOSIS — R188 Other ascites: Secondary | ICD-10-CM | POA: Diagnosis not present

## 2016-02-24 DIAGNOSIS — R1031 Right lower quadrant pain: Secondary | ICD-10-CM | POA: Diagnosis not present

## 2016-02-24 DIAGNOSIS — R1084 Generalized abdominal pain: Secondary | ICD-10-CM | POA: Diagnosis not present

## 2016-02-24 DIAGNOSIS — C19 Malignant neoplasm of rectosigmoid junction: Secondary | ICD-10-CM | POA: Diagnosis not present

## 2016-02-24 DIAGNOSIS — R05 Cough: Secondary | ICD-10-CM | POA: Diagnosis not present

## 2016-02-24 LAB — CBC WITH DIFFERENTIAL/PLATELET
BASOS ABS: 0 10*3/uL (ref 0.0–0.1)
BASOS PCT: 0 %
EOS PCT: 0 %
Eosinophils Absolute: 0 10*3/uL (ref 0.0–0.7)
HCT: 38.3 % (ref 36.0–46.0)
Hemoglobin: 13.2 g/dL (ref 12.0–15.0)
Lymphocytes Relative: 14 %
Lymphs Abs: 1.6 10*3/uL (ref 0.7–4.0)
MCH: 29.1 pg (ref 26.0–34.0)
MCHC: 34.5 g/dL (ref 30.0–36.0)
MCV: 84.5 fL (ref 78.0–100.0)
MONO ABS: 1 10*3/uL (ref 0.1–1.0)
MONOS PCT: 8 %
Neutro Abs: 9.2 10*3/uL — ABNORMAL HIGH (ref 1.7–7.7)
Neutrophils Relative %: 78 %
PLATELETS: 225 10*3/uL (ref 150–400)
RBC: 4.53 MIL/uL (ref 3.87–5.11)
RDW: 14.9 % (ref 11.5–15.5)
WBC: 11.9 10*3/uL — ABNORMAL HIGH (ref 4.0–10.5)

## 2016-02-24 LAB — COMPREHENSIVE METABOLIC PANEL
ALK PHOS: 57 U/L (ref 38–126)
ALT: 9 U/L — ABNORMAL LOW (ref 14–54)
ANION GAP: 5 (ref 5–15)
AST: 18 U/L (ref 15–41)
Albumin: 3.4 g/dL — ABNORMAL LOW (ref 3.5–5.0)
BILIRUBIN TOTAL: 0.7 mg/dL (ref 0.3–1.2)
BUN: 8 mg/dL (ref 6–20)
CALCIUM: 8.7 mg/dL — AB (ref 8.9–10.3)
CO2: 24 mmol/L (ref 22–32)
Chloride: 107 mmol/L (ref 101–111)
Creatinine, Ser: 0.66 mg/dL (ref 0.44–1.00)
Glucose, Bld: 101 mg/dL — ABNORMAL HIGH (ref 65–99)
Potassium: 3.3 mmol/L — ABNORMAL LOW (ref 3.5–5.1)
Sodium: 136 mmol/L (ref 135–145)
TOTAL PROTEIN: 6.4 g/dL — AB (ref 6.5–8.1)

## 2016-02-24 LAB — URINALYSIS, ROUTINE W REFLEX MICROSCOPIC
Bilirubin Urine: NEGATIVE
Glucose, UA: NEGATIVE mg/dL
KETONES UR: 40 mg/dL — AB
NITRITE: NEGATIVE
PROTEIN: NEGATIVE mg/dL
Specific Gravity, Urine: 1.018 (ref 1.005–1.030)
pH: 6.5 (ref 5.0–8.0)

## 2016-02-24 LAB — URINE MICROSCOPIC-ADD ON

## 2016-02-24 LAB — LIPASE, BLOOD: Lipase: 19 U/L (ref 11–51)

## 2016-02-24 MED ORDER — ONDANSETRON 4 MG PO TBDP: 4.0000 mg | ORAL_TABLET | Freq: Four times a day (QID) | ORAL | Status: DC | PRN

## 2016-02-24 MED ORDER — HYDROMORPHONE HCL 1 MG/ML IJ SOLN
1.0000 mg | INTRAMUSCULAR | Status: DC | PRN
  Administered 2016-02-24 – 2016-02-25 (×3): 1 mg via INTRAVENOUS
  Filled 2016-02-24 (×3): qty 1

## 2016-02-24 MED ORDER — POTASSIUM CHLORIDE IN NACL 20-0.9 MEQ/L-% IV SOLN
INTRAVENOUS | Status: DC
  Administered 2016-02-24: 1000 mL via INTRAVENOUS
  Filled 2016-02-24 (×4): qty 1000

## 2016-02-24 MED ORDER — MORPHINE SULFATE (PF) 4 MG/ML IV SOLN
4.0000 mg | Freq: Once | INTRAVENOUS | Status: AC
  Administered 2016-02-24: 4 mg via INTRAVENOUS
  Filled 2016-02-24: qty 1

## 2016-02-24 MED ORDER — SODIUM CHLORIDE 0.9 % IV BOLUS (SEPSIS)
1000.0000 mL | Freq: Once | INTRAVENOUS | Status: AC
  Administered 2016-02-24: 1000 mL via INTRAVENOUS

## 2016-02-24 MED ORDER — LORAZEPAM 2 MG/ML IJ SOLN
1.0000 mg | Freq: Once | INTRAMUSCULAR | Status: AC
  Administered 2016-02-24: 1 mg via INTRAVENOUS
  Filled 2016-02-24: qty 1

## 2016-02-24 MED ORDER — HEPARIN SODIUM (PORCINE) 5000 UNIT/ML IJ SOLN
5000.0000 [IU] | Freq: Once | INTRAMUSCULAR | Status: AC
  Administered 2016-02-24: 5000 [IU] via SUBCUTANEOUS
  Filled 2016-02-24: qty 1

## 2016-02-24 MED ORDER — IOPAMIDOL (ISOVUE-300) INJECTION 61%
100.0000 mL | Freq: Once | INTRAVENOUS | Status: AC | PRN
  Administered 2016-02-24: 100 mL via INTRAVENOUS

## 2016-02-24 MED ORDER — HYDROMORPHONE HCL 1 MG/ML IJ SOLN
1.0000 mg | Freq: Once | INTRAMUSCULAR | Status: AC
  Administered 2016-02-24: 1 mg via INTRAVENOUS
  Filled 2016-02-24: qty 1

## 2016-02-24 MED ORDER — ONDANSETRON HCL 4 MG/2ML IJ SOLN: 4.0000 mg | Freq: Four times a day (QID) | INTRAMUSCULAR | Status: DC | PRN

## 2016-02-24 NOTE — ED Provider Notes (Signed)
CSN: [id number]     Arrival date & time 02/24/16  1333 History   First MD Initiated Contact with Patient 02/24/16 1502     Chief Complaint  Patient presents with  . Abdominal Pain     (Consider location/radiation/quality/duration/timing/severity/associated sxs/prior Treatment) HPI Comments: Patient is a 60 year old female with history of type 2 diabetes. She presents for evaluation of right-sided abdominal pain for the past 2 days. She reports not having a bowel movement in several days. She also reports fever yesterday evening. She had similar complaints several months ago and had a CT scan performed which showed nothing. She made an appointment with a gastroenterologist. She actually had that appointment scheduled for today, but came here instead. She denies any urinary complaints. She denies any bloody stools.  Patient is a 60 y.o. female presenting with abdominal pain. The history is provided by the patient and the spouse.  Abdominal Pain Pain location:  RLQ and RUQ Pain quality: cramping   Pain radiates to:  Does not radiate Pain severity:  Severe Onset quality:  Sudden Duration:  2 days Timing:  Constant Progression:  Worsening Chronicity:  New Relieved by:  Nothing Worsened by:  Nothing tried Ineffective treatments:  None tried Associated symptoms: constipation and fever   Associated symptoms: no chills and no diarrhea     Past Medical History  Diagnosis Date  . Diabetes mellitus without complication ([place])   . Hyperlipidemia    Past Surgical History  Procedure Laterality Date  . Spinal surgeries  2003, 2004, 2005   Family History  Problem Relation Age of Onset  . Lung cancer Father    Social History  Substance Use Topics  . Smoking status: Current Every Day Smoker -- 1.00 packs/day    Types: Cigarettes  . Smokeless tobacco: None  . Alcohol Use: No   OB History    No data available     Review of Systems  Constitutional: Positive for fever. Negative for  chills.  Gastrointestinal: Positive for abdominal pain and constipation. Negative for diarrhea.  All other systems reviewed and are negative.     Allergies  Amoxicillin; Pregabalin; Amoxicillin-pot clavulanate; Barium-containing compounds; Gadolinium derivatives; Pantoprazole; Topamax; and Morphine and related  Home Medications   Prior to Admission medications   Medication Sig Start Date End Date Taking? Authorizing Provider  Esomeprazole Magnesium (NEXIUM PO) Take by mouth.   Yes Historical Provider, MD  ALPRAZolam (XANAX) 0.25 MG tablet Take 0.25 mg by mouth 3 (three) times daily as needed for anxiety or sleep.     Historical Provider, MD  ibuprofen (ADVIL,MOTRIN) 100 MG chewable tablet Chew 200 mg by mouth every 8 (eight) hours as needed for fever or mild pain.     Historical Provider, MD  ibuprofen (ADVIL,MOTRIN) 200 MG tablet Take 400 mg by mouth every 6 (six) hours as needed for moderate pain.    Historical Provider, MD  omeprazole (PRILOSEC) 20 MG capsule Take 40 mg by mouth daily.    Historical Provider, MD  ondansetron (ZOFRAN) 4 MG tablet Take 1 tablet (4 mg total) by mouth every 8 (eight) hours as needed for nausea or vomiting. 09/13/15   Margarita Mail, PA-C  oxyCODONE-acetaminophen (PERCOCET) 5-325 MG tablet Take 1-2 tablets by mouth every 4 (four) hours as needed. 09/13/15   Margarita Mail, PA-C  Probiotic Product (PROBIOTIC DAILY PO) Take 1 tablet by mouth daily.     Historical Provider, MD   BP 110/63 mmHg  Pulse 79  Temp(Src) 98.2 F (36.8 C) (  Oral)  Resp 20  Ht '5\' 6"'$  (1.676 m)  Wt 154 lb (69.854 kg)  BMI 24.87 kg/m2  SpO2 100% Physical Exam  Constitutional: She is oriented to person, place, and time. She appears well-developed and well-nourished. No distress.  HENT:  Head: Normocephalic and atraumatic.  Neck: Normal range of motion. Neck supple.  Cardiovascular: Normal rate and regular rhythm.  Exam reveals no gallop and no friction rub.   No murmur  heard. Pulmonary/Chest: Effort normal and breath sounds normal. No respiratory distress. She has no wheezes.  Abdominal: Soft. Bowel sounds are normal. She exhibits no distension and no mass. There is tenderness.  There is tenderness to palpation in the right upper and right lower quadrant. There is no rebound and no guarding.  Musculoskeletal: Normal range of motion.  Neurological: She is alert and oriented to person, place, and time.  Skin: Skin is warm and dry. She is not diaphoretic.  Nursing note and vitals reviewed.   ED Course  Procedures (including critical care time) Labs Review Labs Reviewed  URINALYSIS, ROUTINE W REFLEX MICROSCOPIC (NOT AT [facility]) - Abnormal; Notable for the following:    Hgb urine dipstick SMALL (*)    Ketones, ur 40 (*)    Leukocytes, UA MODERATE (*)    All other components within normal limits  URINE MICROSCOPIC-ADD ON - Abnormal; Notable for the following:    Squamous Epithelial / LPF 0-5 (*)    Bacteria, UA FEW (*)    All other components within normal limits  COMPREHENSIVE METABOLIC PANEL  CBC WITH DIFFERENTIAL/PLATELET  LIPASE, BLOOD    Imaging Review No results found. I have personally reviewed and evaluated these images and lab results as part of my medical decision-making.   MDM   Final diagnoses:  None    CT scan reveals an intussusception at the ileocolic junction measuring approximately 8 cm with a slightly thickened appendix. I've discussed this finding with Dr. Excell Seltzer from general surgery who is recommending transfer to [facility].    Veryl Speak, MD 02/24/16 [phone]

## 2016-02-24 NOTE — ED Notes (Signed)
Pt walked by nurse first desk with her purse and states she needs to put her purse in her car. Made pt RN aware. Pt walked out to car. Pt observed smoking on way to car. Keli RN went out to talk with pt.

## 2016-02-24 NOTE — ED Notes (Signed)
Patient walked to room 4, patient undressing and placing gown on opening in the back, Patient spouse at bedside.

## 2016-02-24 NOTE — H&P (Signed)
Mary Andrade is an 60 y.o. female.    Chief Complaint: Abdominal pain  HPI: Patient is a pleasant 60 year old female We presents with acute right sided abdominal pain. She states she has a history of an episode of right-sided abdominal pain in November of last year. This prompted a visit to the emergency room and a CT scan was reportedly negative. She saw Dr. Michail Sermon at that time. Colonoscopy was normal 5 years ago. About 2 weeks ago she had another self-limited episode of right-sided abdominal pain. She actually scheduled an appointment to see Dr. Michail Sermon tomorrow. Yesterday afternoon she again developed pain at this time the pain persisted and was more severe. She describes crampy pain in her right lower quadrant that radiates across her lower abdomen. It waxes and wanes. She has had some mild nausea but no vomiting. No abdominal distention. Had a normal bowel movement 3 days ago. No melena or hematochezia. No fever or chills. No urinary symptoms.  Past Medical History  Diagnosis Date  . Diabetes mellitus without complication ([place])   . Hyperlipidemia     Past Surgical History  Procedure Laterality Date  . Spinal surgeries  2003, 2004, 2005    Family History  Problem Relation Age of Onset  . Lung cancer Father    Social History:  reports that she has been smoking Cigarettes.  She has been smoking about 1.00 pack per day. She does not have any smokeless tobacco history on file. She reports that she does not drink alcohol or use illicit drugs.  Allergies:  Allergies  Allergen Reactions  . Amoxicillin Other (See Comments)    With prednisone?  Caused Chest pains, abdominal pains  . Pregabalin Other (See Comments)    Could not lift head  . Amoxicillin-Pot Clavulanate Other (See Comments)    Side pain  . Barium-Containing Compounds Other (See Comments)    GI tract, facial numbness, constipation  . Gadolinium Derivatives Other (See Comments)    For MRI, most recently for head scan,  had no issues.   . Pantoprazole Other (See Comments)    Cannot swallow  . Topamax [Topiramate] Other (See Comments)    Hair loss  . Morphine And Related Itching    Medications Prior to Admission  Medication Sig Dispense Refill  . Esomeprazole Magnesium (NEXIUM PO) Take by mouth.    . ALPRAZolam (XANAX) 0.25 MG tablet Take 0.25 mg by mouth 3 (three) times daily as needed for anxiety or sleep.     Marland Kitchen ibuprofen (ADVIL,MOTRIN) 100 MG chewable tablet Chew 200 mg by mouth every 8 (eight) hours as needed for fever or mild pain.     Marland Kitchen ibuprofen (ADVIL,MOTRIN) 200 MG tablet Take 400 mg by mouth every 6 (six) hours as needed for moderate pain.    Marland Kitchen omeprazole (PRILOSEC) 20 MG capsule Take 40 mg by mouth daily.    . ondansetron (ZOFRAN) 4 MG tablet Take 1 tablet (4 mg total) by mouth every 8 (eight) hours as needed for nausea or vomiting. 10 tablet 0  . oxyCODONE-acetaminophen (PERCOCET) 5-325 MG tablet Take 1-2 tablets by mouth every 4 (four) hours as needed. 20 tablet 0  . Probiotic Product (PROBIOTIC DAILY PO) Take 1 tablet by mouth daily.       Results for orders placed or performed during the hospital encounter of 02/24/16 (from the past 48 hour(s))  Urinalysis, Routine w reflex microscopic (not at [facility])     Status: Abnormal   Collection Time: 02/24/16  1:35 PM  Result Value Ref Range   Color, Urine YELLOW YELLOW   APPearance CLEAR CLEAR   Specific Gravity, Urine 1.018 1.005 - 1.030   pH 6.5 5.0 - 8.0   Glucose, UA NEGATIVE NEGATIVE mg/dL   Hgb urine dipstick SMALL (A) NEGATIVE   Bilirubin Urine NEGATIVE NEGATIVE   Ketones, ur 40 (A) NEGATIVE mg/dL   Protein, ur NEGATIVE NEGATIVE mg/dL   Nitrite NEGATIVE NEGATIVE   Leukocytes, UA MODERATE (A) NEGATIVE  Urine microscopic-add on     Status: Abnormal   Collection Time: 02/24/16  1:35 PM  Result Value Ref Range   Squamous Epithelial / LPF 0-5 (A) NONE SEEN   WBC, UA 6-30 0 - 5 WBC/hpf   RBC / HPF 6-30 0 - 5 RBC/hpf   Bacteria, UA FEW  (A) NONE SEEN   Urine-Other MUCOUS PRESENT   Comprehensive metabolic panel     Status: Abnormal   Collection Time: 02/24/16  3:35 PM  Result Value Ref Range   Sodium 136 135 - 145 mmol/L   Potassium 3.3 (L) 3.5 - 5.1 mmol/L   Chloride 107 101 - 111 mmol/L   CO2 24 22 - 32 mmol/L   Glucose, Bld 101 (H) 65 - 99 mg/dL   BUN 8 6 - 20 mg/dL   Creatinine, Ser 0.66 0.44 - 1.00 mg/dL   Calcium 8.7 (L) 8.9 - 10.3 mg/dL   Total Protein 6.4 (L) 6.5 - 8.1 g/dL   Albumin 3.4 (L) 3.5 - 5.0 g/dL   AST 18 15 - 41 U/L   ALT 9 (L) 14 - 54 U/L   Alkaline Phosphatase 57 38 - 126 U/L   Total Bilirubin 0.7 0.3 - 1.2 mg/dL   GFR calc non Af Amer >60 >60 mL/min   GFR calc Af Amer >60 >60 mL/min    Comment: (NOTE) The eGFR has been calculated using the CKD EPI equation. This calculation has not been validated in all clinical situations. eGFR's persistently <60 mL/min signify possible Chronic Kidney Disease.    Anion gap 5 5 - 15  CBC with Differential     Status: Abnormal   Collection Time: 02/24/16  3:35 PM  Result Value Ref Range   WBC 11.9 (H) 4.0 - 10.5 K/uL   RBC 4.53 3.87 - 5.11 MIL/uL   Hemoglobin 13.2 12.0 - 15.0 g/dL   HCT 38.3 36.0 - 46.0 %   MCV 84.5 78.0 - 100.0 fL   MCH 29.1 26.0 - 34.0 pg   MCHC 34.5 30.0 - 36.0 g/dL   RDW 14.9 11.5 - 15.5 %   Platelets 225 150 - 400 K/uL   Neutrophils Relative % 78 %   Neutro Abs 9.2 (H) 1.7 - 7.7 K/uL   Lymphocytes Relative 14 %   Lymphs Abs 1.6 0.7 - 4.0 K/uL   Monocytes Relative 8 %   Monocytes Absolute 1.0 0.1 - 1.0 K/uL   Eosinophils Relative 0 %   Eosinophils Absolute 0.0 0.0 - 0.7 K/uL   Basophils Relative 0 %   Basophils Absolute 0.0 0.0 - 0.1 K/uL  Lipase, blood     Status: None   Collection Time: 02/24/16  3:35 PM  Result Value Ref Range   Lipase 19 11 - 51 U/L   Ct Abdomen Pelvis W Contrast  02/24/2016  CLINICAL DATA:  Right lower quadrant abdominal pain for 2 days. Nausea. EXAM: CT ABDOMEN AND PELVIS WITH CONTRAST TECHNIQUE:  Multidetector CT imaging of the abdomen and pelvis was performed using the  standard protocol following bolus administration of intravenous contrast. CONTRAST:  127m ISOVUE-300 IOPAMIDOL (ISOVUE-300) INJECTION 61% COMPARISON:  08/13/2015 FINDINGS: Lower chest: Dependent subsegmental atelectasis in both lower lobes. Hepatobiliary: Unremarkable Pancreas: Unremarkable Spleen: Unremarkable Adrenals/Urinary Tract: Unremarkable Stomach/Bowel: Ileocolic intussusception with 7.8 cm excursion. Interestingly, the appendix is incorporated into the intussusception which also includes adipose tissue difficult to exclude wall thickening in the cecum. Dilution of contrast in the distal ileum which is mildly dilated. There is edema surrounding the intussusception site. The appendix measures up to 8 mm in thickness, mildly abnormally thickened. Vascular/Lymphatic: Aortoiliac atherosclerotic vascular disease. 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 IMPRESSION: 1. Ileocolic intussusception measuring about 7.8 cm in length, also incorporating the slightly thickened appendix, with a small amount of ascites surrounding the intussusceptions site and also down in the pelvis. I am doubtful of acute appendicitis although given the slight wall thickening of the appendix, appendicitis is not totally excluded. Intussusception and adult is frequently accompanied by an underlying lesion, and it may be prudent to screen the colon after a prep per reduction, particularly the cecum, to ensure that there is not an underlying occult mass. 2. 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 3.  Aortoiliac atherosclerotic vascular disease. Electronically Signed   By: WVan ClinesM.D.   On: 02/24/2016 18:03    Review of  Systems  Constitutional: Negative for fever, chills and weight loss.  Respiratory: Negative.   Cardiovascular: Negative.   Gastrointestinal: Positive for heartburn, nausea and abdominal pain. Negative for vomiting, diarrhea, constipation, blood in stool and melena.  Genitourinary: Negative.   Musculoskeletal: Positive for back pain.  Neurological: Positive for sensory change and focal weakness.    Blood pressure 92/69, pulse 81, temperature 99.2 F (37.3 C), temperature source Oral, resp. rate 19, height 5' 6"  (1.676 m), weight 69.854 kg (154 lb), SpO2 99 %. Physical Exam  General: Alert, well-developed Caucasian female, Appears somewhat uncomfortable but no severe distress Skin: Warm and dry without rash or infection. HEENT: No palpable masses or thyromegaly. Sclera nonicteric. Pupils equal round and reactive. Oropharynx clear. Lymph nodes: No cervical, supraclavicular, or inguinal nodes palpable. Lungs: Breath sounds clear and equal without increased work of breathing Cardiovascular: Regular rate and rhythm without murmur. No JVD or edema. Peripheral pulses intact. Abdomen: Nondistended. Soft with localized right lower quadrant tenderness and some voluntary guarding but no peritoneal signs. No masses palpable. No organomegaly. No palpable hernias. Extremities: No edema or joint swelling or deformity. No chronic venous stasis changes. Neurologic: Alert and fully oriented. No gross motor deficits. Affect and proximal content normal  Assessment/Plan Recurrent and now acute more severe and persistent right-sided abdominal pain with CT scan showing an ileocolonic intussusception. No obvious mass seen on CT scan but at high risk for a malignant or benign neoplasm as a lead point. I have recommended admission and plans for ileocolectomy which hopefully could be laparoscopic assisted. No evidence of ischemia or perforation seen tonight clinically. Patient will be treated with IV fluids and pain  medication and will discuss proceeding with surgery tomorrow. I discussed the diagnosis and treatment in detail with the patient and her husband and all questions answered.  HEdward Jolly MD 02/24/2016, 9:04 PM

## 2016-02-24 NOTE — ED Notes (Signed)
Patient walked back to room. Made aware of the policy for smoking and that the patient is not allowed to walk about side. Patient brought back into room, and patient is calm and cooperative reading a book. Appears to be in no distress

## 2016-02-24 NOTE — ED Notes (Addendum)
Right lower abdominal pain. Fever last night. She has been constipated. Recently started taking Iron.

## 2016-02-25 ENCOUNTER — Inpatient Hospital Stay (HOSPITAL_COMMUNITY): Payer: Medicare Other | Admitting: Anesthesiology

## 2016-02-25 ENCOUNTER — Encounter (HOSPITAL_COMMUNITY): Payer: Self-pay

## 2016-02-25 ENCOUNTER — Encounter (HOSPITAL_COMMUNITY): Admission: EM | Disposition: A | Discharge status: Home / Self Care | Payer: Self-pay | Source: Home / Self Care

## 2016-02-25 ENCOUNTER — Inpatient Hospital Stay (HOSPITAL_COMMUNITY): Payer: Medicare Other

## 2016-02-25 DIAGNOSIS — K561 Intussusception: Secondary | ICD-10-CM | POA: Diagnosis present

## 2016-02-25 HISTORY — PX: PARTIAL COLECTOMY: SHX5273

## 2016-02-25 LAB — CBC
HEMATOCRIT: 38.8 % (ref 36.0–46.0)
Hemoglobin: 12.8 g/dL (ref 12.0–15.0)
MCH: 28.6 pg (ref 26.0–34.0)
MCHC: 33 g/dL (ref 30.0–36.0)
MCV: 86.6 fL (ref 78.0–100.0)
PLATELETS: 215 10*3/uL (ref 150–400)
RBC: 4.48 MIL/uL (ref 3.87–5.11)
RDW: 15 % (ref 11.5–15.5)
WBC: 13.5 10*3/uL — AB (ref 4.0–10.5)

## 2016-02-25 LAB — SURGICAL PCR SCREEN
MRSA, PCR: NEGATIVE
Staphylococcus aureus: NEGATIVE

## 2016-02-25 LAB — TYPE AND SCREEN
ABO/RH(D): O NEG
Antibody Screen: NEGATIVE

## 2016-02-25 LAB — ABO/RH: ABO/RH(D): O NEG

## 2016-02-25 LAB — CREATININE, SERUM: Creatinine, Ser: 0.65 mg/dL (ref 0.44–1.00)

## 2016-02-25 SURGERY — COLECTOMY, PARTIAL
Anesthesia: General | Laterality: Right

## 2016-02-25 MED ORDER — HYDROMORPHONE HCL 1 MG/ML IJ SOLN
INTRAMUSCULAR | Status: DC | PRN
  Administered 2016-02-25 (×2): 1 mg via INTRAVENOUS

## 2016-02-25 MED ORDER — SUGAMMADEX SODIUM 200 MG/2ML IV SOLN
INTRAVENOUS | Status: DC | PRN
  Administered 2016-02-25: 200 mg via INTRAVENOUS

## 2016-02-25 MED ORDER — LORAZEPAM 2 MG/ML IJ SOLN
0.5000 mg | Freq: Once | INTRAMUSCULAR | Status: AC
  Administered 2016-02-25: 0.5 mg via INTRAVENOUS
  Filled 2016-02-25: qty 1

## 2016-02-25 MED ORDER — ONDANSETRON HCL 4 MG/2ML IJ SOLN: 4.0000 mg | Freq: Once | INTRAMUSCULAR | Status: DC | PRN

## 2016-02-25 MED ORDER — ACETAMINOPHEN 325 MG PO TABS: 650.0000 mg | ORAL_TABLET | Freq: Four times a day (QID) | ORAL | Status: DC | PRN

## 2016-02-25 MED ORDER — LORAZEPAM 2 MG/ML IJ SOLN
0.5000 mg | Freq: Three times a day (TID) | INTRAMUSCULAR | Status: DC | PRN
  Administered 2016-02-25 – 2016-03-01 (×11): 0.5 mg via INTRAVENOUS
  Filled 2016-02-25 (×11): qty 1

## 2016-02-25 MED ORDER — FENTANYL 40 MCG/ML IV SOLN
INTRAVENOUS | Status: DC
  Administered 2016-02-25: 16:00:00 via INTRAVENOUS
  Filled 2016-02-25: qty 25

## 2016-02-25 MED ORDER — SODIUM CHLORIDE 0.9% FLUSH: 9.0000 mL | INTRAVENOUS | Status: DC | PRN

## 2016-02-25 MED ORDER — FENTANYL CITRATE (PF) 250 MCG/5ML IJ SOLN
INTRAMUSCULAR | Status: AC
  Filled 2016-02-25: qty 5

## 2016-02-25 MED ORDER — PROPOFOL 10 MG/ML IV BOLUS
INTRAVENOUS | Status: AC
  Filled 2016-02-25: qty 20

## 2016-02-25 MED ORDER — ONDANSETRON HCL 4 MG/2ML IJ SOLN
4.0000 mg | Freq: Four times a day (QID) | INTRAMUSCULAR | Status: DC | PRN
  Administered 2016-02-27: 4 mg via INTRAVENOUS
  Filled 2016-02-25: qty 2

## 2016-02-25 MED ORDER — ONDANSETRON HCL 4 MG/2ML IJ SOLN
INTRAMUSCULAR | Status: DC | PRN
  Administered 2016-02-25: 4 mg via INTRAVENOUS

## 2016-02-25 MED ORDER — ACETAMINOPHEN 650 MG RE SUPP: 650.0000 mg | Freq: Four times a day (QID) | RECTAL | Status: DC | PRN

## 2016-02-25 MED ORDER — MIDAZOLAM HCL 5 MG/5ML IJ SOLN
INTRAMUSCULAR | Status: DC | PRN
  Administered 2016-02-25: 2 mg via INTRAVENOUS

## 2016-02-25 MED ORDER — HYDROMORPHONE HCL 2 MG/ML IJ SOLN
INTRAMUSCULAR | Status: AC
  Filled 2016-02-25: qty 1

## 2016-02-25 MED ORDER — FENTANYL CITRATE (PF) 100 MCG/2ML IJ SOLN
INTRAMUSCULAR | Status: DC | PRN
  Administered 2016-02-25 (×5): 50 ug via INTRAVENOUS

## 2016-02-25 MED ORDER — HYDROMORPHONE HCL 1 MG/ML IJ SOLN
INTRAMUSCULAR | Status: AC
  Administered 2016-02-25: 0.5 mg via INTRAVENOUS
  Filled 2016-02-25: qty 1

## 2016-02-25 MED ORDER — ROCURONIUM BROMIDE 100 MG/10ML IV SOLN
INTRAVENOUS | Status: DC | PRN
  Administered 2016-02-25 (×2): 10 mg via INTRAVENOUS
  Administered 2016-02-25: 30 mg via INTRAVENOUS

## 2016-02-25 MED ORDER — DIPHENHYDRAMINE HCL 50 MG/ML IJ SOLN
12.5000 mg | Freq: Four times a day (QID) | INTRAMUSCULAR | Status: DC | PRN
  Administered 2016-02-26: 12.5 mg via INTRAVENOUS
  Filled 2016-02-25: qty 1

## 2016-02-25 MED ORDER — NALOXONE HCL 0.4 MG/ML IJ SOLN: 0.4000 mg | INTRAMUSCULAR | Status: DC | PRN

## 2016-02-25 MED ORDER — DEXAMETHASONE SODIUM PHOSPHATE 10 MG/ML IJ SOLN
INTRAMUSCULAR | Status: AC
  Filled 2016-02-25: qty 1

## 2016-02-25 MED ORDER — METRONIDAZOLE IN NACL 5-0.79 MG/ML-% IV SOLN
500.0000 mg | Freq: Three times a day (TID) | INTRAVENOUS | Status: AC
  Administered 2016-02-25 – 2016-02-26 (×3): 500 mg via INTRAVENOUS
  Filled 2016-02-25 (×3): qty 100

## 2016-02-25 MED ORDER — CIPROFLOXACIN IN D5W 400 MG/200ML IV SOLN
400.0000 mg | Freq: Two times a day (BID) | INTRAVENOUS | Status: AC
  Administered 2016-02-25 – 2016-02-26 (×2): 400 mg via INTRAVENOUS
  Filled 2016-02-25 (×2): qty 200

## 2016-02-25 MED ORDER — HYDROMORPHONE HCL 1 MG/ML IJ SOLN
0.2500 mg | INTRAMUSCULAR | Status: DC | PRN
  Administered 2016-02-25 (×2): 0.5 mg via INTRAVENOUS

## 2016-02-25 MED ORDER — PROPOFOL 10 MG/ML IV BOLUS
INTRAVENOUS | Status: DC | PRN
  Administered 2016-02-25: 160 mg via INTRAVENOUS

## 2016-02-25 MED ORDER — ONDANSETRON HCL 4 MG/2ML IJ SOLN: 4.0000 mg | Freq: Four times a day (QID) | INTRAMUSCULAR | Status: DC | PRN

## 2016-02-25 MED ORDER — KCL IN DEXTROSE-NACL 20-5-0.45 MEQ/L-%-% IV SOLN
INTRAVENOUS | Status: DC
  Administered 2016-02-25: 16:00:00 via INTRAVENOUS
  Administered 2016-02-26 – 2016-02-27 (×2): 100 mL/h via INTRAVENOUS
  Filled 2016-02-25 (×5): qty 1000

## 2016-02-25 MED ORDER — LACTATED RINGERS IV SOLN
INTRAVENOUS | Status: DC
  Administered 2016-02-25: 13:00:00 via INTRAVENOUS
  Administered 2016-02-25: 1000 mL via INTRAVENOUS

## 2016-02-25 MED ORDER — ONDANSETRON HCL 4 MG/2ML IJ SOLN
INTRAMUSCULAR | Status: AC
  Filled 2016-02-25: qty 2

## 2016-02-25 MED ORDER — PANTOPRAZOLE SODIUM 40 MG IV SOLR
40.0000 mg | Freq: Every day | INTRAVENOUS | Status: DC
  Administered 2016-02-25 – 2016-03-01 (×6): 40 mg via INTRAVENOUS
  Filled 2016-02-25 (×7): qty 40

## 2016-02-25 MED ORDER — ONDANSETRON 4 MG PO TBDP: 4.0000 mg | ORAL_TABLET | Freq: Four times a day (QID) | ORAL | Status: DC | PRN

## 2016-02-25 MED ORDER — METRONIDAZOLE IN NACL 5-0.79 MG/ML-% IV SOLN
INTRAVENOUS | Status: AC
  Filled 2016-02-25: qty 100

## 2016-02-25 MED ORDER — ROCURONIUM BROMIDE 50 MG/5ML IV SOLN
INTRAVENOUS | Status: AC
  Filled 2016-02-25: qty 1

## 2016-02-25 MED ORDER — CIPROFLOXACIN IN D5W 400 MG/200ML IV SOLN
INTRAVENOUS | Status: AC
  Filled 2016-02-25: qty 200

## 2016-02-25 MED ORDER — DIPHENHYDRAMINE HCL 12.5 MG/5ML PO ELIX: 12.5000 mg | ORAL_SOLUTION | Freq: Four times a day (QID) | ORAL | Status: DC | PRN

## 2016-02-25 MED ORDER — LIDOCAINE HCL (CARDIAC) 20 MG/ML IV SOLN
INTRAVENOUS | Status: DC | PRN
  Administered 2016-02-25: 80 mg via INTRAVENOUS

## 2016-02-25 MED ORDER — DEXAMETHASONE SODIUM PHOSPHATE 10 MG/ML IJ SOLN
INTRAMUSCULAR | Status: DC | PRN
  Administered 2016-02-25: 10 mg via INTRAVENOUS

## 2016-02-25 MED ORDER — SUGAMMADEX SODIUM 200 MG/2ML IV SOLN
INTRAVENOUS | Status: AC
  Filled 2016-02-25: qty 2

## 2016-02-25 MED ORDER — CIPROFLOXACIN IN D5W 400 MG/200ML IV SOLN
400.0000 mg | INTRAVENOUS | Status: AC
  Administered 2016-02-25: 400 mg via INTRAVENOUS

## 2016-02-25 MED ORDER — MIDAZOLAM HCL 2 MG/2ML IJ SOLN
INTRAMUSCULAR | Status: AC
  Filled 2016-02-25: qty 2

## 2016-02-25 MED ORDER — LIDOCAINE HCL (CARDIAC) 20 MG/ML IV SOLN
INTRAVENOUS | Status: AC
  Filled 2016-02-25: qty 5

## 2016-02-25 MED ORDER — METRONIDAZOLE IN NACL 5-0.79 MG/ML-% IV SOLN
500.0000 mg | INTRAVENOUS | Status: AC
  Administered 2016-02-25: 500 mg via INTRAVENOUS
  Filled 2016-02-25: qty 100

## 2016-02-25 MED ORDER — ENOXAPARIN SODIUM 40 MG/0.4ML ~~LOC~~ SOLN
40.0000 mg | SUBCUTANEOUS | Status: DC
  Administered 2016-02-26 – 2016-03-01 (×5): 40 mg via SUBCUTANEOUS
  Filled 2016-02-25 (×6): qty 0.4

## 2016-02-25 MED ORDER — HYDROMORPHONE 1 MG/ML IV SOLN
INTRAVENOUS | Status: DC
  Administered 2016-02-25: 20:00:00 via INTRAVENOUS
  Administered 2016-02-26: 1.5 mg via INTRAVENOUS
  Administered 2016-02-26: 2.7 mg via INTRAVENOUS
  Filled 2016-02-25: qty 25

## 2016-02-25 MED ORDER — DIPHENHYDRAMINE HCL 50 MG/ML IJ SOLN: 12.5000 mg | Freq: Four times a day (QID) | INTRAMUSCULAR | Status: DC | PRN

## 2016-02-25 MED ORDER — SUCCINYLCHOLINE CHLORIDE 20 MG/ML IJ SOLN
INTRAMUSCULAR | Status: DC | PRN
  Administered 2016-02-25: 100 mg via INTRAVENOUS

## 2016-02-25 SURGICAL SUPPLY — 52 items
BLADE EXTENDED COATED 6.5IN (ELECTRODE) ×2 IMPLANT
BLADE HEX COATED 2.75 (ELECTRODE) ×2 IMPLANT
BLADE SURG SZ10 CARB STEEL (BLADE) IMPLANT
CHLORAPREP W/TINT 26ML (MISCELLANEOUS) IMPLANT
COVER MAYO STAND STRL (DRAPES) ×6 IMPLANT
COVER SURGICAL LIGHT HANDLE (MISCELLANEOUS) IMPLANT
DRAPE LAPAROSCOPIC ABDOMINAL (DRAPES) ×2 IMPLANT
DRAPE SHEET LG 3/4 BI-LAMINATE (DRAPES) IMPLANT
DRAPE UTILITY XL STRL (DRAPES) IMPLANT
DRAPE WARM FLUID 44X44 (DRAPE) ×2 IMPLANT
DRSG OPSITE POSTOP 4X10 (GAUZE/BANDAGES/DRESSINGS) IMPLANT
DRSG OPSITE POSTOP 4X6 (GAUZE/BANDAGES/DRESSINGS) IMPLANT
DRSG OPSITE POSTOP 4X8 (GAUZE/BANDAGES/DRESSINGS) IMPLANT
DRSG PAD ABDOMINAL 8X10 ST (GAUZE/BANDAGES/DRESSINGS) IMPLANT
ELECT PENCIL ROCKER SW 15FT (MISCELLANEOUS) ×2 IMPLANT
ELECT REM PT RETURN 9FT ADLT (ELECTROSURGICAL) ×2
ELECTRODE REM PT RTRN 9FT ADLT (ELECTROSURGICAL) ×1 IMPLANT
GAUZE SPONGE 4X4 12PLY STRL (GAUZE/BANDAGES/DRESSINGS) IMPLANT
GLOVE SURG ORTHO 8.0 STRL STRW (GLOVE) ×24 IMPLANT
GOWN STRL REUS W/TWL XL LVL3 (GOWN DISPOSABLE) ×12 IMPLANT
HANDLE SUCTION POOLE (INSTRUMENTS) ×1 IMPLANT
KIT BASIN OR (CUSTOM PROCEDURE TRAY) IMPLANT
LEGGING LITHOTOMY PAIR STRL (DRAPES) IMPLANT
LIGASURE IMPACT 36 18CM CVD LR (INSTRUMENTS) ×2 IMPLANT
LUBRICANT JELLY K Y 4OZ (MISCELLANEOUS) IMPLANT
PACK COLON (CUSTOM PROCEDURE TRAY) ×2 IMPLANT
PACK GENERAL/GYN (CUSTOM PROCEDURE TRAY) IMPLANT
RELOAD PROXIMATE 75MM BLUE (ENDOMECHANICALS) ×4 IMPLANT
SPONGE LAP 18X18 X RAY DECT (DISPOSABLE) IMPLANT
STAPLER GUN LINEAR PROX 60 (STAPLE) ×2 IMPLANT
STAPLER PROXIMATE 75MM BLUE (STAPLE) ×2 IMPLANT
STAPLER VISISTAT 35W (STAPLE) ×2 IMPLANT
SUCTION POOLE HANDLE (INSTRUMENTS) ×2
SUT NOV 1 T60/GS (SUTURE) IMPLANT
SUT NOVA NAB DX-16 0-1 5-0 T12 (SUTURE) IMPLANT
SUT NOVA T20/GS 25 (SUTURE) IMPLANT
SUT PDS AB 1 CT1 27 (SUTURE) ×2 IMPLANT
SUT PDS AB 1 TP1 96 (SUTURE) IMPLANT
SUT SILK 2 0 (SUTURE) ×1
SUT SILK 2 0 SH CR/8 (SUTURE) ×2 IMPLANT
SUT SILK 2 0SH CR/8 30 (SUTURE) IMPLANT
SUT SILK 2-0 18XBRD TIE 12 (SUTURE) ×1 IMPLANT
SUT SILK 2-0 30XBRD TIE 12 (SUTURE) IMPLANT
SUT SILK 3 0 (SUTURE) ×1
SUT SILK 3 0 SH CR/8 (SUTURE) ×2 IMPLANT
SUT SILK 3-0 18XBRD TIE 12 (SUTURE) ×1 IMPLANT
TOWEL OR 17X26 10 PK STRL BLUE (TOWEL DISPOSABLE) IMPLANT
TOWEL OR NON WOVEN STRL DISP B (DISPOSABLE) IMPLANT
TRAY FOLEY W/METER SILVER 14FR (SET/KITS/TRAYS/PACK) ×2 IMPLANT
TRAY FOLEY W/METER SILVER 16FR (SET/KITS/TRAYS/PACK) IMPLANT
TUBING CONNECTING 10 (TUBING) IMPLANT
YANKAUER SUCT BULB TIP 10FT TU (MISCELLANEOUS) ×2 IMPLANT

## 2016-02-25 NOTE — Anesthesia Preprocedure Evaluation (Signed)
Anesthesia Evaluation  Patient identified by MRN, date of birth, ID band Patient awake    Reviewed: Allergy & Precautions, H&P , NPO status , Patient's Chart, lab work & pertinent test results  History of Anesthesia Complications Negative for: history of anesthetic complications  Airway Mallampati: II  TM Distance: >3 FB Neck ROM: full    Dental no notable dental hx.    Pulmonary Current Smoker,    Pulmonary exam normal breath sounds clear to auscultation       Cardiovascular negative cardio ROS Normal cardiovascular exam Rhythm:regular Rate:Normal     Neuro/Psych negative neurological ROS     GI/Hepatic negative GI ROS, Neg liver ROS,   Endo/Other  diabetes  Renal/GU negative Renal ROS     Musculoskeletal   Abdominal   Peds  Hematology negative hematology ROS (+)   Anesthesia Other Findings   Reproductive/Obstetrics negative OB ROS                             Anesthesia Physical Anesthesia Plan  ASA: III  Anesthesia Plan: General   Post-op Pain Management:    Induction: Intravenous  Airway Management Planned: Oral ETT  Additional Equipment:   Intra-op Plan:   Post-operative Plan:   Informed Consent: I have reviewed the patients History and Physical, chart, labs and discussed the procedure including the risks, benefits and alternatives for the proposed anesthesia with the patient or authorized representative who has indicated his/her understanding and acceptance.   Dental Advisory Given  Plan Discussed with: Anesthesiologist, CRNA and Surgeon  Anesthesia Plan Comments:         Anesthesia Quick Evaluation

## 2016-02-25 NOTE — Anesthesia Procedure Notes (Signed)
Procedure Name: Intubation Date/Time: 02/25/2016 12:08 PM Performed by: Freddie Breech Pre-anesthesia Checklist: Patient identified, Emergency Drugs available, Suction available, Patient being monitored and Timeout performed Patient Re-evaluated:Patient Re-evaluated prior to inductionOxygen Delivery Method: Circle system utilized Preoxygenation: Pre-oxygenation with 100% oxygen Intubation Type: IV induction Ventilation: Mask ventilation without difficulty Laryngoscope Size: Mac and 3 Grade View: Grade II Tube type: Oral Tube size: 7.0 mm Number of attempts: 1 Airway Equipment and Method: Patient positioned with wedge pillow and Stylet Placement Confirmation: ETT inserted through vocal cords under direct vision,  positive ETCO2,  CO2 detector and breath sounds checked- equal and bilateral Secured at: 22 cm Tube secured with: Tape Dental Injury: Teeth and Oropharynx as per pre-operative assessment

## 2016-02-25 NOTE — Brief Op Note (Signed)
02/24/2016 - 02/25/2016  1:58 PM  PATIENT:  Mary Andrade  60 y.o. female  PRE-OPERATIVE DIAGNOSIS:  ileocolonic intussusception  POST-OPERATIVE DIAGNOSIS:  ileocolonic intussusception  PROCEDURE:  Procedure(s): OPEN RIGHT COLECTOMY (Right)  SURGEON:  Surgeon(s) and Role:    * Armandina Gemma, MD - Primary  ANESTHESIA:   general  EBL:  Total I/O In: 1000 [I.V.:1000] Out: 400 [Urine:200; Blood:200]  BLOOD ADMINISTERED:none  DRAINS: none   LOCAL MEDICATIONS USED:  NONE  SPECIMEN:  Excision  DISPOSITION OF SPECIMEN:  PATHOLOGY  COUNTS:  YES  TOURNIQUET:  * No tourniquets in log *  DICTATION: .Other Dictation: Dictation Number [id number]  PLAN OF CARE: Admit to inpatient   PATIENT DISPOSITION:  PACU - hemodynamically stable.   Delay start of Pharmacological VTE agent (>24hrs) due to surgical blood loss or risk of bleeding: yes  [name], MD, [facility] Office: [phone]

## 2016-02-25 NOTE — Progress Notes (Signed)
Patient ID: Mary Andrade, female   DOB: [birth year], 60 y.o.   MRN: [id number]  [facility]  Subjective: Patient in bed, moderate pain.  Husband at bedside.  Anticipating surgery today.  Objective: Vital signs in last 24 hours: Temp:  [98.2 F (36.8 C)-100.6 F (38.1 C)] 100.6 F (38.1 C) (05/10 0620) Pulse Rate:  [64-81] 71 (05/10 0620) Resp:  [18-20] 18 (05/10 0620) BP: (87-110)/(45-72) 95/45 mmHg (05/10 0620) SpO2:  [95 %-100 %] 96 % (05/10 0620) Weight:  [69.854 kg (154 lb)] 69.854 kg (154 lb) (05/09 1340) Last BM Date: 02/21/16  Intake/Output from previous day: 05/09 0701 - 05/10 0700 In: 1147.5 [P.O.:60; I.V.:1087.5] Out: 450 [Urine:450] Intake/Output this shift:    Physical Exam: HEENT - sclerae clear, mucous membranes moist Neck - soft Abdomen - soft, mild tender right mid abdomen, no mass Ext - no edema, non-tender Neuro - alert & oriented, no focal deficits  Lab Results:   Recent Labs  02/24/16 1535  WBC 11.9*  HGB 13.2  HCT 38.3  PLT 225   BMET  Recent Labs  02/24/16 1535  NA 136  K 3.3*  CL 107  CO2 24  GLUCOSE 101*  BUN 8  CREATININE 0.66  CALCIUM 8.7*   PT/INR No results for input(s): LABPROT, INR in the last 72 hours. Comprehensive Metabolic Panel:    Component Value Date/Time   NA 136 02/24/2016 1535   NA 142 09/13/2015 1756   K 3.3* 02/24/2016 1535   K 3.7 09/13/2015 1756   CL 107 02/24/2016 1535   CL 108 09/13/2015 1756   CO2 24 02/24/2016 1535   CO2 26 09/13/2015 1756   BUN 8 02/24/2016 1535   BUN <5* 09/13/2015 1756   CREATININE 0.66 02/24/2016 1535   CREATININE 0.96 09/13/2015 1756   GLUCOSE 101* 02/24/2016 1535   GLUCOSE 105* 09/13/2015 1756   CALCIUM 8.7* 02/24/2016 1535   CALCIUM 9.6 09/13/2015 1756   AST 18 02/24/2016 1535   AST 25 09/13/2015 1756   ALT 9* 02/24/2016 1535   ALT 15 09/13/2015 1756   ALKPHOS 57 02/24/2016 1535   ALKPHOS 51 09/13/2015 1756   BILITOT 0.7  02/24/2016 1535   BILITOT 0.5 09/13/2015 1756   PROT 6.4* 02/24/2016 1535   PROT 6.7 09/13/2015 1756   ALBUMIN 3.4* 02/24/2016 1535   ALBUMIN 4.0 09/13/2015 1756    Studies/Results: Ct Abdomen Pelvis W Contrast  02/24/2016  CLINICAL DATA:  Right lower quadrant abdominal pain for 2 days. Nausea. EXAM: CT ABDOMEN AND PELVIS WITH CONTRAST TECHNIQUE: Multidetector CT imaging of the abdomen and pelvis was performed using the standard protocol following bolus administration of intravenous contrast. CONTRAST:  127m ISOVUE-300 IOPAMIDOL (ISOVUE-300) INJECTION 61% COMPARISON:  08/13/2015 FINDINGS: Lower chest: Dependent subsegmental atelectasis in both lower lobes. Hepatobiliary: Unremarkable Pancreas: Unremarkable Spleen: Unremarkable Adrenals/Urinary Tract: Unremarkable Stomach/Bowel: Ileocolic intussusception with 7.8 cm excursion. Interestingly, the appendix is incorporated into the intussusception which also includes adipose tissue difficult to exclude wall thickening in the cecum. Dilution of contrast in the distal ileum which is mildly dilated. There is edema surrounding the intussusception site. The appendix measures up to 8 mm in thickness, mildly abnormally thickened. Vascular/Lymphatic: Aortoiliac atherosclerotic vascular disease. 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 IMPRESSION: 1. Ileocolic intussusception measuring about 7.8 cm in length, also incorporating the slightly thickened appendix, with a  small amount of ascites surrounding the intussusceptions site and also down in the pelvis. I am doubtful of acute appendicitis although given the slight wall thickening of the appendix, appendicitis is not totally excluded. Intussusception and adult is frequently accompanied by an underlying lesion, and it may be prudent to screen the colon after a prep per  reduction, particularly the cecum, to ensure that there is not an underlying occult mass. 2. 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 3.  Aortoiliac atherosclerotic vascular disease. Electronically Signed   By: Van Clines M.D.   On: 02/24/2016 18:03    Assessment & Plans: Ileocolonic intussusception  Plan OR later today for right colectomy  Orders entered  The risks and benefits of the procedure have been discussed at length with the patient.  The patient understands the proposed procedure, potential alternative treatments, and the course of recovery to be expected.  All of the patient's questions have been answered at this time.  The patient wishes to proceed with surgery.  Mary Regal, MD, [facility] Office: [place] 02/25/2016

## 2016-02-25 NOTE — Transfer of Care (Signed)
Immediate Anesthesia Transfer of Care Note  Patient: Mary Andrade  Procedure(s) Performed: Procedure(s): OPEN RIGHT COLECTOMY (Right)  Patient Location: PACU  Anesthesia Type:General  Level of Consciousness: awake, alert  and oriented  Airway & Oxygen Therapy: Patient Spontanous Breathing and Patient connected to face mask oxygen  Post-op Assessment: Report given to RN and Post -op Vital signs reviewed and stable  Post vital signs: Reviewed and stable  Last Vitals:  Filed Vitals:   02/25/16 0620 02/25/16 0950  BP: 95/45 109/63  Pulse: 71 84  Temp: 38.1 C 38 C  Resp: 18 18    Last Pain:  Filed Vitals:   02/25/16 1103  PainSc: 6       Patients Stated Pain Goal: 2 (75/05/18 3358)  Complications: No apparent anesthesia complications

## 2016-02-25 NOTE — Anesthesia Postprocedure Evaluation (Signed)
Anesthesia Post Note  Patient: Mary Andrade  Procedure(s) Performed: Procedure(s) (LRB): OPEN RIGHT COLECTOMY (Right)  Patient location during evaluation: PACU Anesthesia Type: General Level of consciousness: awake and alert Pain management: pain level controlled Vital Signs Assessment: post-procedure vital signs reviewed and stable Respiratory status: spontaneous breathing, nonlabored ventilation, respiratory function stable and patient connected to nasal cannula oxygen Cardiovascular status: blood pressure returned to baseline and stable Postop Assessment: no signs of nausea or vomiting Anesthetic complications: no    Last Vitals:  Filed Vitals:   02/25/16 1400 02/25/16 1415  BP: 121/75 111/64  Pulse: 79 80  Temp: 37.1 C   Resp: 14 13    Last Pain:  Filed Vitals:   02/25/16 1419  PainSc: Asleep                 Zenaida Deed

## 2016-02-26 ENCOUNTER — Encounter (HOSPITAL_COMMUNITY): Payer: Self-pay | Admitting: Surgery

## 2016-02-26 LAB — CBC
HCT: 35.3 % — ABNORMAL LOW (ref 36.0–46.0)
Hemoglobin: 11.6 g/dL — ABNORMAL LOW (ref 12.0–15.0)
MCH: 28.5 pg (ref 26.0–34.0)
MCHC: 32.9 g/dL (ref 30.0–36.0)
MCV: 86.7 fL (ref 78.0–100.0)
PLATELETS: 231 10*3/uL (ref 150–400)
RBC: 4.07 MIL/uL (ref 3.87–5.11)
RDW: 14.8 % (ref 11.5–15.5)
WBC: 12.1 10*3/uL — AB (ref 4.0–10.5)

## 2016-02-26 LAB — BASIC METABOLIC PANEL
ANION GAP: 10 (ref 5–15)
BUN: 7 mg/dL (ref 6–20)
CALCIUM: 8.4 mg/dL — AB (ref 8.9–10.3)
CO2: 25 mmol/L (ref 22–32)
Chloride: 102 mmol/L (ref 101–111)
Creatinine, Ser: 0.65 mg/dL (ref 0.44–1.00)
Glucose, Bld: 150 mg/dL — ABNORMAL HIGH (ref 65–99)
POTASSIUM: 4.3 mmol/L (ref 3.5–5.1)
SODIUM: 137 mmol/L (ref 135–145)

## 2016-02-26 MED ORDER — NICOTINE POLACRILEX 2 MG MT GUM
2.0000 mg | CHEWING_GUM | OROMUCOSAL | Status: DC | PRN
  Filled 2016-02-26: qty 1

## 2016-02-26 MED ORDER — NICOTINE 14 MG/24HR TD PT24
14.0000 mg | MEDICATED_PATCH | Freq: Every day | TRANSDERMAL | Status: DC
  Administered 2016-02-26 – 2016-03-02 (×6): 14 mg via TRANSDERMAL
  Filled 2016-02-26 (×6): qty 1

## 2016-02-26 MED ORDER — ACETAMINOPHEN 10 MG/ML IV SOLN
1000.0000 mg | Freq: Four times a day (QID) | INTRAVENOUS | Status: AC
  Administered 2016-02-26 – 2016-02-27 (×3): 1000 mg via INTRAVENOUS
  Filled 2016-02-26 (×3): qty 100

## 2016-02-26 MED ORDER — HYDROMORPHONE HCL 1 MG/ML IJ SOLN
0.5000 mg | INTRAMUSCULAR | Status: DC | PRN
  Administered 2016-02-26 – 2016-03-01 (×22): 1 mg via INTRAVENOUS
  Filled 2016-02-26 (×23): qty 1

## 2016-02-26 MED ORDER — ACETAMINOPHEN 10 MG/ML IV SOLN
1000.0000 mg | Freq: Four times a day (QID) | INTRAVENOUS | Status: DC
  Administered 2016-02-26: 1000 mg via INTRAVENOUS
  Filled 2016-02-26 (×4): qty 100

## 2016-02-26 MED ORDER — SODIUM CHLORIDE 0.9 % IV BOLUS (SEPSIS)
500.0000 mL | Freq: Once | INTRAVENOUS | Status: AC
  Administered 2016-02-26: 500 mL via INTRAVENOUS

## 2016-02-26 NOTE — Progress Notes (Signed)
1 Day Post-Op  Subjective: She is doing well, the CO2 monitor on the PCA has been going off all night and she has got no sleep.  She wants it removed.  Otherwise she is doing well.  Up to BR this AM and cleaning up.  Site looks fine.  Objective: Vital signs in last 24 hours: Temp:  [98 F (36.7 C)-100.4 F (38 C)] 98.3 F (36.8 C) (05/11 0530) Pulse Rate:  [61-96] 65 (05/11 0530) Resp:  [10-19] 12 (05/11 0530) BP: (83-121)/(44-75) 94/52 mmHg (05/11 0530) SpO2:  [92 %-100 %] 96 % (05/11 0530) Last BM Date: 02/21/16 3006 IV 1560 urine Afebrile, BP down some, no fever since 0700 yesterday WBC up but otherwise labs look good No films Intake/Output from previous day: 05/10 0701 - 05/11 0700 In: 3006.7 [I.V.:3006.7] Out: 1760 [Urine:1560; Blood:200] Intake/Output this shift:    General appearance: alert, cooperative and no distress Resp: clear to auscultation bilaterally GI: soft, pretty sore, incision looks good.    Lab Results:   Recent Labs  02/25/16 1609 02/26/16 0454  WBC 13.5* 12.1*  HGB 12.8 11.6*  HCT 38.8 35.3*  PLT 215 231    BMET  Recent Labs  02/24/16 1535 02/25/16 1609 02/26/16 0454  NA 136  --  137  K 3.3*  --  4.3  CL 107  --  102  CO2 24  --  25  GLUCOSE 101*  --  150*  BUN 8  --  7  CREATININE 0.66 0.65 0.65  CALCIUM 8.7*  --  8.4*   PT/INR No results for input(s): LABPROT, INR in the last 72 hours.   Recent Labs Lab 02/24/16 1535  AST 18  ALT 9*  ALKPHOS 57  BILITOT 0.7  PROT 6.4*  ALBUMIN 3.4*     Lipase     Component Value Date/Time   LIPASE 19 02/24/2016 1535     Studies/Results: Dg Chest 2 View  02/25/2016  CLINICAL DATA:  Preoperative evaluation for upcoming collected, cough EXAM: CHEST  2 VIEW COMPARISON:  01/24/2015 FINDINGS: Cardiac shadow is within normal limits. The lungs are well aerated bilaterally. No focal infiltrate or sizable effusion is seen. The osseous structures are within normal limits. IMPRESSION: No  active cardiopulmonary disease. Electronically Signed   By: Inez Catalina M.D.   On: 02/25/2016 09:43   Ct Abdomen Pelvis W Contrast  02/24/2016  CLINICAL DATA:  Right lower quadrant abdominal pain for 2 days. Nausea. EXAM: CT ABDOMEN AND PELVIS WITH CONTRAST TECHNIQUE: Multidetector CT imaging of the abdomen and pelvis was performed using the standard protocol following bolus administration of intravenous contrast. CONTRAST:  17m ISOVUE-300 IOPAMIDOL (ISOVUE-300) INJECTION 61% COMPARISON:  08/13/2015 FINDINGS: Lower chest: Dependent subsegmental atelectasis in both lower lobes. Hepatobiliary: Unremarkable Pancreas: Unremarkable Spleen: Unremarkable Adrenals/Urinary Tract: Unremarkable Stomach/Bowel: Ileocolic intussusception with 7.8 cm excursion. Interestingly, the appendix is incorporated into the intussusception which also includes adipose tissue difficult to exclude wall thickening in the cecum. Dilution of contrast in the distal ileum which is mildly dilated. There is edema surrounding the intussusception site. The appendix measures up to 8 mm in thickness, mildly abnormally thickened. Vascular/Lymphatic: Aortoiliac atherosclerotic vascular disease. 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 IMPRESSION: 1. Ileocolic intussusception measuring about 7.8 cm in length, also incorporating the slightly thickened appendix, with a small amount of ascites surrounding the intussusceptions site and also down  in the pelvis. I am doubtful of acute appendicitis although given the slight wall thickening of the appendix, appendicitis is not totally excluded. Intussusception and adult is frequently accompanied by an underlying lesion, and it may be prudent to screen the colon after a prep per reduction, particularly the cecum, to ensure that there is not an underlying occult mass.  2. 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 3.  Aortoiliac atherosclerotic vascular disease. Electronically Signed   By: Van Clines M.D.   On: 02/24/2016 18:03   Prior to Admission medications   Medication Sig Start Date End Date Taking? Authorizing Provider  ALPRAZolam (XANAX) 0.25 MG tablet Take 0.25 mg by mouth 3 (three) times daily as needed for anxiety or sleep.    Yes Historical Provider, MD  cholecalciferol (VITAMIN D) 1000 units tablet Take 2,000 Units by mouth daily.   Yes Historical Provider, MD  esomeprazole (NEXIUM) 40 MG capsule Take 40 mg by mouth daily at 12 noon.   Yes Historical Provider, MD  ferrous sulfate 325 (65 FE) MG EC tablet Take 325 mg by mouth daily.   Yes Historical Provider, MD  ibuprofen (ADVIL,MOTRIN) 200 MG tablet Take 400 mg by mouth every 6 (six) hours as needed for moderate pain.   Yes Historical Provider, MD  Probiotic Product (PROBIOTIC DAILY PO) Take 1 tablet by mouth daily.    Yes Historical Provider, MD  vitamin E 200 UNIT capsule Take 400 Units by mouth daily.   Yes Historical Provider, MD  ondansetron (ZOFRAN) 4 MG tablet Take 1 tablet (4 mg total) by mouth every 8 (eight) hours as needed for nausea or vomiting. Patient not taking: Reported on 02/24/2016 09/13/15   Margarita Mail, PA-C  oxyCODONE-acetaminophen (PERCOCET) 5-325 MG tablet Take 1-2 tablets by mouth every 4 (four) hours as needed. Patient not taking: Reported on 02/24/2016 09/13/15   Margarita Mail, PA-C    Medications: . ciprofloxacin  400 mg Intravenous Q12H  . enoxaparin (LOVENOX) injection  40 mg Subcutaneous Q24H  . HYDROmorphone   Intravenous Q4H  . metronidazole  500 mg Intravenous Q8H  . pantoprazole (PROTONIX) IV  40 mg Intravenous QHS   . dextrose 5 % and 0.45 % NaCl with KCl 20 mEq/L 100 mL/hr (02/26/16 0515)    Assessment/Plan ileocolonic intussusception OPEN RIGHT COLECTOMY (Right),  02/25/16, Dr. Harlow Asa Hx of AODM Hyperlipidemia Tobacco use FEN:  IV fluids/NPO AY:TKZSW/FUXNAT day 2 VTE:  Lovenox/SCD   Plan:  D/c PCA, nicotine gum, ambulate and await bowel function.        LOS: 2 days    Haeley Fordham 02/26/2016 [phone]

## 2016-02-26 NOTE — Op Note (Signed)
NAMEMarland Kitchen  Mary Andrade, Mary Andrade NO.:  [id number]  MEDICAL RECORD NO.:  [id number]  LOCATION:  4967                         FACILITY:  [facility]  PHYSICIAN:  [name], MD      DATE OF BIRTH:  20-Mar-1956  DATE OF PROCEDURE:  02/25/2016                              OPERATIVE REPORT   PREOPERATIVE DIAGNOSIS:  Ileocolonic intussusception.  POSTOPERATIVE DIAGNOSIS:  Ileocolonic intussusception.  PROCEDURE:  Right colectomy.  SURGEON:  [name], MD, FACS  ANESTHESIA:  General.  ESTIMATED BLOOD LOSS:  150 mL.  PREPARATION:  ChloraPrep.  COMPLICATIONS:  None.  INDICATIONS:  The patient is a 60 year old female, presents to the emergency department with acute right-sided abdominal pain.  The patient has had intermittent episodes dating back to November 2016.  The patient had been evaluated by Gastroenterology.  She has a history of a normal colonoscopy.  The patient comes to the emergency department where evaluation includes a CT scan showing evidence of extensive ileocolonic intussusception.  The patient was prepared urgently and brought to the operating room for right colectomy.  BODY OF REPORT:  Procedure was done in OR #6 at the [facility].  The patient was brought to the operating room, placed in supine position on the operating room table.  Following administration of general anesthesia, the patient was positioned and then prepped and draped in the usual aseptic fashion.  After ascertaining that an adequate level of anesthesia had been achieved, a transverse abdominal incision was made in the mid right abdomen with a #10 blade.  Dissection was carried through subcutaneous tissues.  Fascia was divided.  Rectus muscle was divided.  Posterior sheath was divided and the peritoneal cavity was entered cautiously.  Exploration reveals moderate ascites within the peritoneal cavity.  The cecum was grossly abnormal and distorted.  The tip of the  appendix could be seen emanating from the base of the cecum.  The tissues appeared edematous along the right colon and extending into the retroperitoneum.  Terminal ileum was mildly dilated and it appeared that several centimeters of small bowel are within the right colon.  The mid transverse colon appeared grossly normal.  Balfour retractor was placed for exposure.  Right colon was mobilized along its lateral peritoneal attachments.  Hepatic flexure was taken down using the electrocautery and the LigaSure.  Duodenum was identified and preserved.  Dissection was then carried around the base of the cecum.  Mesentery was incised and divided with the LigaSure.  Dissection was carried up to the small bowel mesentery and the distal ileum was then transected with a GIA stapler.  Remainder of the colonic mesentery was taken down with the LigaSure.  Right colic vessels were divided between Kelly clamps and ligated with 2-0 silk ties.  Dissection was then carried along the ascending colon and along the hepatic flexure to the proximal transverse colon.  At this point, an area of normal- appearing colon was selected and transected with a GIA stapler. Remaining vessels were divided between Kelly clamps and ligated with 2-0 silk ties.  Specimen was passed off the field and submitted to Pathology for review.  Next, a side-to-side functionally end-to-end anastomosis  was created between the ileum and the transverse colon.  Staple lines were inspected for hemostasis.  A side-to-side anastomosis was created with a GIA stapler and enterotomy was closed with a TA-60 stapler.  Mesenteric defect was closed with interrupted 3-0 and 2-0 silk sutures. Anastomosis appeared to be widely patent.  Abdomen was then irrigated copiously with warm saline which was evacuated.  Good hemostasis was noted throughout the operative field. Omentum was used to cover the anastomosis and the small bowel.  Gowns and gloves  were changed.  Drapes were changed.  The abdominal wall incision was then closed in 2 layers with running #1 PDS suture. Subcutaneous tissues were irrigated.  Skin was closed with stainless steel staples.  Sterile dressings were applied.  The patient was awakened from anesthesia and brought to the recovery room.  The patient tolerated the procedure well.   [name], MD, [facility] Office: [phone]    TMG/MEDQ  D:  02/25/2016  T:  02/26/2016  Job:  [id number]  cc:   Lear Ng, MD Fax: [phone]

## 2016-02-27 MED ORDER — ACETAMINOPHEN 10 MG/ML IV SOLN
1000.0000 mg | Freq: Four times a day (QID) | INTRAVENOUS | Status: AC
  Administered 2016-02-27 (×2): 1000 mg via INTRAVENOUS
  Filled 2016-02-27 (×3): qty 100

## 2016-02-27 MED ORDER — KCL IN DEXTROSE-NACL 20-5-0.9 MEQ/L-%-% IV SOLN
INTRAVENOUS | Status: DC
  Administered 2016-02-27 – 2016-03-01 (×6): via INTRAVENOUS
  Filled 2016-02-27 (×10): qty 1000

## 2016-02-27 MED ORDER — SODIUM CHLORIDE 0.9 % IV BOLUS (SEPSIS)
1000.0000 mL | Freq: Once | INTRAVENOUS | Status: AC
  Administered 2016-02-27: 1000 mL via INTRAVENOUS

## 2016-02-27 NOTE — Progress Notes (Signed)
2 Days Post-Op  Subjective: Her BP was down last PM so she has not had any pain medicines for a while, she feels pretty sore now.  A little flatus, I told her i would give her some clears and to go slow.  BP is still an issue.  I will put her on some NS and hopefully she will keep her BP up.    Objective: Vital signs in last 24 hours: Temp:  [98.2 F (36.8 C)-99 F (37.2 C)] 98.7 F (37.1 C) (05/12 0535) Pulse Rate:  [56-70] 63 (05/12 0535) Resp:  [16-18] 16 (05/12 0535) BP: (81-108)/(40-66) 108/66 mmHg (05/12 0535) SpO2:  [100 %] 100 % (05/12 0535) Last BM Date: 02/21/16 NPO 2800 urine BP down in the AM, but better yesteday BMP OK WBC still up some Intake/Output from previous day: 05/11 0701 - 05/12 0700 In: 2400 [I.V.:2400] Out: 2800 [Urine:2800] Intake/Output this shift:    General appearance: alert, cooperative and no distress Resp: clear to auscultation bilaterally GI: soft, few BS, incision looks great.    Lab Results:   Recent Labs  02/25/16 1609 02/26/16 0454  WBC 13.5* 12.1*  HGB 12.8 11.6*  HCT 38.8 35.3*  PLT 215 231    BMET  Recent Labs  02/24/16 1535 02/25/16 1609 02/26/16 0454  NA 136  --  137  K 3.3*  --  4.3  CL 107  --  102  CO2 24  --  25  GLUCOSE 101*  --  150*  BUN 8  --  7  CREATININE 0.66 0.65 0.65  CALCIUM 8.7*  --  8.4*   PT/INR No results for input(s): LABPROT, INR in the last 72 hours.   Recent Labs Lab 02/24/16 1535  AST 18  ALT 9*  ALKPHOS 57  BILITOT 0.7  PROT 6.4*  ALBUMIN 3.4*     Lipase     Component Value Date/Time   LIPASE 19 02/24/2016 1535     Studies/Results: Dg Chest 2 View  02/25/2016  CLINICAL DATA:  Preoperative evaluation for upcoming collected, cough EXAM: CHEST  2 VIEW COMPARISON:  01/24/2015 FINDINGS: Cardiac shadow is within normal limits. The lungs are well aerated bilaterally. No focal infiltrate or sizable effusion is seen. The osseous structures are within normal limits. IMPRESSION: No  active cardiopulmonary disease. Electronically Signed   By: Inez Catalina M.D.   On: 02/25/2016 09:43    Medications: . enoxaparin (LOVENOX) injection  40 mg Subcutaneous Q24H  . nicotine  14 mg Transdermal Daily  . pantoprazole (PROTONIX) IV  40 mg Intravenous QHS   . dextrose 5 % and 0.45 % NaCl with KCl 20 mEq/L 100 mL/hr (02/27/16 0344)    Assessment/Plan ileocolonic intussusception OPEN RIGHT COLECTOMY (Right), 02/25/16, Dr. Harlow Asa  POD 2 Hx of AODM Hyperlipidemia Tobacco use FEN: IV fluids/NPO QQ:VZDGL/OVFIEP day 3 VTE: Lovenox/SCD   Plan:  Give her some extra fluid, change to 5NS +KCl, start her on some clears, I told her to go slow.  Husband is with her and will help her walk and get around this AM.  She was a little dizzy sitting up.  BP 120/72  With me.    LOS: 3 days    JENNINGS,WILLARD 02/27/2016 [facility]  Patient seen and examined.  Ambulating.  Husband at bedside.  Wound clean, dry and intact.  Abd soft.  BS present.  Taking limited clear liquids.  Awaiting path results.  [name], MD,  [facility] Office: [phone]

## 2016-02-27 NOTE — Care Management Important Message (Signed)
Important Message  Patient Details  Name: Mary Andrade MRN: [id number] Date of Birth: Oct 22, 1955   Medicare Important Message Given:  Yes    Camillo Flaming 02/27/2016, 10:54 AMImportant Message  Patient Details  Name: Mary Andrade MRN: [id number] Date of Birth: 12-29-1955   Medicare Important Message Given:  Yes    Camillo Flaming 02/27/2016, 10:54 AM

## 2016-02-28 NOTE — Progress Notes (Signed)
3 Days Post-Op  Subjective: Pt doing well. Tol CLD.  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 - MARGINS NOT INVOLVED. - NINETEEN BENIGN LYMPH NODES (0/19).  Objective: Vital signs in last 24 hours: Temp:  [98.1 F (36.7 C)-98.4 F (36.9 C)] 98.3 F (36.8 C) (05/13 0540) Pulse Rate:  [50-64] 63 (05/13 0540) Resp:  [16-20] 20 (05/13 0540) BP: (93-107)/(43-73) 93/43 mmHg (05/13 0540) SpO2:  [97 %-100 %] 97 % (05/13 0540) Last BM Date: 02/21/16  Intake/Output from previous day: 05/12 0701 - 05/13 0700 In: 800 [I.V.:800] Out: -  Intake/Output this shift:    General appearance: alert and cooperative GI: soft, approp ttp, incision c/d/i  Lab Results:   Recent Labs  02/25/16 1609 02/26/16 0454  WBC 13.5* 12.1*  HGB 12.8 11.6*  HCT 38.8 35.3*  PLT 215 231   BMET  Recent Labs  02/25/16 1609 02/26/16 0454  NA  --  137  K  --  4.3  CL  --  102  CO2  --  25  GLUCOSE  --  150*  BUN  --  7  CREATININE 0.65 0.65  CALCIUM  --  8.4*    Anti-infectives: Anti-infectives    Start     Dose/Rate Route Frequency Ordered Stop   02/25/16 2200  ciprofloxacin (CIPRO) IVPB 400 mg     400 mg 200 mL/hr over 60 Minutes Intravenous Every 12 hours 02/25/16 1522 02/26/16 1109   02/25/16 2000  metroNIDAZOLE (FLAGYL) IVPB 500 mg     500 mg 100 mL/hr over 60 Minutes Intravenous Every 8 hours 02/25/16 1522 02/26/16 1311   02/25/16 1115  metroNIDAZOLE (FLAGYL) IVPB 500 mg     500 mg 100 mL/hr over 60 Minutes Intravenous On call to O.R. 02/25/16 1106 02/25/16 1316   02/25/16 1115  ciprofloxacin (CIPRO) IVPB 400 mg     400 mg 200 mL/hr over 60 Minutes Intravenous On call to O.R. 02/25/16 1106 02/25/16 1308      Assessment/Plan: s/p Procedure(s): OPEN RIGHT COLECTOMY (Right) Advance diet to FLD OK to  shower Pt will need f/u with Oncology as outpt Mobilize  LOS: 4 days    Rosario Jacks., [facility] 02/28/2016

## 2016-02-29 MED ORDER — DIPHENHYDRAMINE HCL 25 MG PO CAPS
25.0000 mg | ORAL_CAPSULE | Freq: Every evening | ORAL | Status: DC | PRN
  Administered 2016-02-29 – 2016-03-01 (×2): 25 mg via ORAL
  Filled 2016-02-29 (×2): qty 1

## 2016-02-29 MED ORDER — DIPHENHYDRAMINE HCL 25 MG PO CAPS
25.0000 mg | ORAL_CAPSULE | Freq: Once | ORAL | Status: AC
  Administered 2016-02-29: 25 mg via ORAL
  Filled 2016-02-29: qty 1

## 2016-02-29 NOTE — Progress Notes (Deleted)
Pt requested at 2020 that he wanted his IV Dilaudid at 2320.  He had been receiving it approx every 3 hrs.  I checked on pt a little after 2300 and he was asleep, so I did not wake him to give him his IV dilaudid.

## 2016-02-29 NOTE — Progress Notes (Signed)
4 Days Post-Op  Subjective: PT doing well with no issues.  Passing Flatus No BM  Objective: Vital signs in last 24 hours: Temp:  [97.8 F (36.6 C)-98.6 F (37 C)] 98.6 F (37 C) (05/14 0549) Pulse Rate:  [53-76] 53 (05/14 0549) Resp:  [16-21] 16 (05/14 0549) BP: (92-122)/(51-78) 92/51 mmHg (05/14 0549) SpO2:  [96 %-100 %] 99 % (05/14 0549) Last BM Date:  (PTA)  Intake/Output from previous day: 05/13 0701 - 05/14 0700 In: 3975 [P.O.:720; I.V.:3255] Out: 5500 [Urine:5500] Intake/Output this shift:    General appearance: alert and cooperative GI: soft, non-tender; bowel sounds normal; no masses,  no organomegaly and incision c/d/i  Anti-infectives: Anti-infectives    Start     Dose/Rate Route Frequency Ordered Stop   02/25/16 2200  ciprofloxacin (CIPRO) IVPB 400 mg     400 mg 200 mL/hr over 60 Minutes Intravenous Every 12 hours 02/25/16 1522 02/26/16 1109   02/25/16 2000  metroNIDAZOLE (FLAGYL) IVPB 500 mg     500 mg 100 mL/hr over 60 Minutes Intravenous Every 8 hours 02/25/16 1522 02/26/16 1311   02/25/16 1115  metroNIDAZOLE (FLAGYL) IVPB 500 mg     500 mg 100 mL/hr over 60 Minutes Intravenous On call to O.R. 02/25/16 1106 02/25/16 1316   02/25/16 1115  ciprofloxacin (CIPRO) IVPB 400 mg     400 mg 200 mL/hr over 60 Minutes Intravenous On call to O.R. 02/25/16 1106 02/25/16 1308      Assessment/Plan: s/p Procedure(s): OPEN RIGHT COLECTOMY (Right) Advance diet to soft, likely Reg tomorrow Likely home tomorrow if tol PO Mobilize BEnadryl for  sleep  LOS: 5 days    Rosario Jacks., [facility] 02/29/2016

## 2016-02-29 NOTE — Progress Notes (Signed)
Earlier in shift pt became emotional.  He stated it was because hadn't been able to sleep at night since she had been here.  I notified provider on call, and obtained a one time order for po Benadryl.  Will continue to monitor.

## 2016-03-01 MED ORDER — SODIUM CHLORIDE 0.9% FLUSH: 3.0000 mL | INTRAVENOUS | Status: DC | PRN

## 2016-03-01 MED ORDER — HYDROCODONE-ACETAMINOPHEN 5-325 MG PO TABS
1.0000 | ORAL_TABLET | ORAL | Status: DC | PRN
  Administered 2016-03-01 – 2016-03-02 (×4): 2 via ORAL
  Filled 2016-03-01 (×5): qty 2

## 2016-03-01 MED ORDER — DOCUSATE SODIUM 100 MG PO CAPS
100.0000 mg | ORAL_CAPSULE | Freq: Two times a day (BID) | ORAL | Status: DC
  Administered 2016-03-01 – 2016-03-02 (×3): 100 mg via ORAL
  Filled 2016-03-01 (×3): qty 1

## 2016-03-01 MED ORDER — FERROUS SULFATE 325 (65 FE) MG PO TABS
325.0000 mg | ORAL_TABLET | Freq: Every day | ORAL | Status: DC
  Administered 2016-03-01 – 2016-03-02 (×2): 325 mg via ORAL
  Filled 2016-03-01 (×2): qty 1

## 2016-03-01 MED ORDER — SODIUM CHLORIDE 0.9% FLUSH
3.0000 mL | Freq: Two times a day (BID) | INTRAVENOUS | Status: DC
  Administered 2016-03-01 – 2016-03-02 (×2): 3 mL via INTRAVENOUS

## 2016-03-01 MED ORDER — ALUM & MAG HYDROXIDE-SIMETH 200-200-20 MG/5ML PO SUSP
30.0000 mL | ORAL | Status: DC | PRN
  Administered 2016-03-01: 30 mL via ORAL
  Filled 2016-03-01: qty 30

## 2016-03-01 MED ORDER — VITAMIN E 180 MG (400 UNIT) PO CAPS
400.0000 [IU] | ORAL_CAPSULE | Freq: Every day | ORAL | Status: DC
  Administered 2016-03-01 – 2016-03-02 (×2): 400 [IU] via ORAL
  Filled 2016-03-01 (×2): qty 1

## 2016-03-01 MED ORDER — ALPRAZOLAM 0.25 MG PO TABS
0.2500 mg | ORAL_TABLET | Freq: Three times a day (TID) | ORAL | Status: DC | PRN
  Administered 2016-03-01: 0.25 mg via ORAL
  Filled 2016-03-01 (×2): qty 1

## 2016-03-01 MED ORDER — VITAMIN D 1000 UNITS PO TABS
2000.0000 [IU] | ORAL_TABLET | Freq: Every day | ORAL | Status: DC
  Administered 2016-03-01 – 2016-03-02 (×2): 2000 [IU] via ORAL
  Filled 2016-03-01 (×2): qty 2

## 2016-03-01 MED ORDER — POLYETHYLENE GLYCOL 3350 17 G PO PACK
17.0000 g | PACK | Freq: Every day | ORAL | Status: DC
  Administered 2016-03-01 – 2016-03-02 (×2): 17 g via ORAL
  Filled 2016-03-01 (×2): qty 1

## 2016-03-01 NOTE — Progress Notes (Signed)
Patient ID: Mary Andrade, female   DOB: 12-09-1955, 60 y.o.   MRN: [id number]     [facility]      [address] [place], [place] [address]    Phone: [phone] FAX: [phone]     Subjective: Passing flatus.  No n/v. Vss. Afebrile. Tolerating POs, but increased pain after oral intake. Ambulating. No dysuria.   Objective:  Vital signs:  Filed Vitals:   02/29/16 1411 02/29/16 1837 03/01/16 0210 03/01/16 0500  BP: 100/68 121/68 110/64 97/44  Pulse: 74 66 51 50  Temp: 97.8 F (36.6 C) 97.9 F (36.6 C) 98.1 F (36.7 C) 98.1 F (36.7 C)  TempSrc: Oral Oral Oral Oral  Resp: '16 18 18 16  '$ Height:      Weight:      SpO2: 97% 99% 100% 97%    Last BM Date:  (PTA)  Intake/Output   Yesterday:  05/14 0701 - 05/15 0700 In: 6294 [P.O.:840; I.V.:2400] Out: 3050 [Urine:3050] This shift:    I/O last 3 completed shifts: In: 7654 [P.O.:840; I.V.:3600] Out: 6503 [Urine:5950]     Physical Exam: General: Pt awake/alert/oriented x4 in no acute distress  Abdomen: Soft.  Nondistended.  Mildly tender at incisions only.  Transverse rlq incision without erythema, staples in place. No evidence of peritonitis.  No incarcerated hernias.    Problem List:   Active Problems:   Intussusception ([place])   Intussusception of colon ([place])   Intussusception, ileocecal ([place])    Results:   Labs: No results found for this or any previous visit (from the past 73 hour(s)).  Imaging / Studies: No results found.  Medications / Allergies:  Scheduled Meds: . cholecalciferol  2,000 Units Oral Daily  . docusate sodium  100 mg Oral BID  . enoxaparin (LOVENOX) injection  40 mg Subcutaneous Q24H  . ferrous sulfate  325 mg Oral Daily  . nicotine  14 mg Transdermal Daily  . pantoprazole (PROTONIX) IV  40 mg Intravenous QHS  . polyethylene glycol  17 g Oral Daily  . sodium chloride flush  3 mL Intravenous Q12H  . vitamin E  400 Units Oral Daily    Continuous Infusions:  PRN Meds:.acetaminophen **OR** acetaminophen, ALPRAZolam, diphenhydrAMINE, HYDROcodone-acetaminophen, HYDROmorphone (DILAUDID) injection, ondansetron **OR** ondansetron (ZOFRAN) IV, sodium chloride flush  Antibiotics: Anti-infectives    Start     Dose/Rate Route Frequency Ordered Stop   02/25/16 2200  ciprofloxacin (CIPRO) IVPB 400 mg     400 mg 200 mL/hr over 60 Minutes Intravenous Every 12 hours 02/25/16 1522 02/26/16 1109   02/25/16 2000  metroNIDAZOLE (FLAGYL) IVPB 500 mg     500 mg 100 mL/hr over 60 Minutes Intravenous Every 8 hours 02/25/16 1522 02/26/16 1311   02/25/16 1115  metroNIDAZOLE (FLAGYL) IVPB 500 mg     500 mg 100 mL/hr over 60 Minutes Intravenous On call to O.R. 02/25/16 1106 02/25/16 1316   02/25/16 1115  ciprofloxacin (CIPRO) IVPB 400 mg     400 mg 200 mL/hr over 60 Minutes Intravenous On call to O.R. 02/25/16 1106 02/25/16 1308        Assessment/Plan Ileocolonic intussusception, colorectal adenocarcinoma POD#4 Open right colectomy---Dr. Harlow Asa -add PO pain meds, resume home meds, mobilize  -pathology has been discussed, OP oncology follow up  FEN-sliv, add colace and miralax VTE prophylaxis-SCD/lovenox Dispo-anticipate DC 24 hours   Erby Pian, ANP-BC [facility] Surgery Pager [phone](7A-4:30P)   03/01/2016 8:47 AM

## 2016-03-02 MED ORDER — POLYETHYLENE GLYCOL 3350 17 G PO PACK: 17.0000 g | PACK | Freq: Every day | ORAL | Status: DC

## 2016-03-02 MED ORDER — HYDROCODONE-ACETAMINOPHEN 5-325 MG PO TABS: 1.0000 | ORAL_TABLET | ORAL | Status: DC | PRN

## 2016-03-02 MED ORDER — DOCUSATE SODIUM 100 MG PO CAPS: 100.0000 mg | ORAL_CAPSULE | Freq: Two times a day (BID) | ORAL | Status: DC

## 2016-03-02 MED ORDER — BISACODYL 10 MG RE SUPP
10.0000 mg | Freq: Once | RECTAL | Status: AC
  Administered 2016-03-02: 10 mg via RECTAL
  Filled 2016-03-02: qty 1

## 2016-03-02 NOTE — Care Management Important Message (Signed)
Important Message  Patient Details  Name: Mary Andrade MRN: [id number] Date of Birth: [birth year]   Medicare Important Message Given:  Yes    Camillo Flaming 03/02/2016, 9:28 AMImportant Message  Patient Details  Name: Mary Andrade MRN: [id number] Date of Birth: 04-20-1956   Medicare Important Message Given:  Yes    Camillo Flaming 03/02/2016, 9:28 AM

## 2016-03-02 NOTE — Progress Notes (Signed)
RN reviewed discharge instructions with patient and family. All questions answered.   Paperwork and prescriptions given.   NT rolled patient down in wheelchair with all belongings to family car.

## 2016-03-02 NOTE — Discharge Instructions (Signed)

## 2016-03-02 NOTE — Progress Notes (Signed)
Patient ID: Mary Andrade, female   DOB: Jan 25, 1956, 60 y.o.   MRN: [id number]     [address] [place], [place] [address]    Phone: [phone] FAX: [phone]     Subjective: Some nausea, appetite is poor, passing flatus, no bm. Ambulating. Vss. Afebrile.   Objective:  Vital signs:  Filed Vitals:   03/01/16 0500 03/01/16 1440 03/01/16 2219 03/02/16 0525  BP: 97/44 105/51 112/72 95/61  Pulse: 50 42 63 56  Temp: 98.1 F (36.7 C)  98.2 F (36.8 C) 98 F (36.7 C)  TempSrc: Oral  Oral Oral  Resp: '16 17 18 16  '$ Height:      Weight:      SpO2: 97% 97% 98% 98%    Last BM Date:  (PTA)  Intake/Output   Yesterday:  05/15 0701 - 05/16 0700 In: 963 [P.O.:960; I.V.:3] Out: -  This shift:    I/O last 3 completed shifts: In: 2163 [P.O.:960; I.V.:1203] Out: 1600 [Urine:1600]    Physical Exam: General: Pt awake/alert/oriented x4 in no acute distress Neck: Supple, No tracheal deviation Chest:  Cta.  No chest wall pain w good excursion CV:  Pulses intact.  Regular rhythm Abdomen: Soft.  Nondistended.  Mildly tender at incision only. Transverse rlq incision is clean, no erythema, wound edges are approximated.  No evidence of peritonitis.  No incarcerated hernias. Ext:  SCDs BLE.  No mjr edema.  No cyanosis Skin: No petechiae / purpura   Problem List:   Active Problems:   Intussusception ([place])   Intussusception of colon ([place])   Intussusception, ileocecal ([place])    Results:   Labs: No results found for this or any previous visit (from the past 20 hour(s)).  Imaging / Studies: No results found.  Medications / Allergies:  Scheduled Meds: . bisacodyl  10 mg Rectal Once  . cholecalciferol  2,000 Units Oral Daily  . docusate sodium  100 mg Oral BID  . enoxaparin (LOVENOX) injection  40 mg Subcutaneous Q24H  . ferrous sulfate  325 mg Oral Daily  . nicotine  14 mg Transdermal Daily  . pantoprazole  (PROTONIX) IV  40 mg Intravenous QHS  . polyethylene glycol  17 g Oral Daily  . sodium chloride flush  3 mL Intravenous Q12H  . vitamin E  400 Units Oral Daily   Continuous Infusions:  PRN Meds:.acetaminophen **OR** acetaminophen, ALPRAZolam, alum & mag hydroxide-simeth, diphenhydrAMINE, HYDROcodone-acetaminophen, HYDROmorphone (DILAUDID) injection, ondansetron **OR** ondansetron (ZOFRAN) IV, sodium chloride flush  Antibiotics: Anti-infectives    Start     Dose/Rate Route Frequency Ordered Stop   02/25/16 2200  ciprofloxacin (CIPRO) IVPB 400 mg     400 mg 200 mL/hr over 60 Minutes Intravenous Every 12 hours 02/25/16 1522 02/26/16 1109   02/25/16 2000  metroNIDAZOLE (FLAGYL) IVPB 500 mg     500 mg 100 mL/hr over 60 Minutes Intravenous Every 8 hours 02/25/16 1522 02/26/16 1311   02/25/16 1115  metroNIDAZOLE (FLAGYL) IVPB 500 mg     500 mg 100 mL/hr over 60 Minutes Intravenous On call to O.R. 02/25/16 1106 02/25/16 1316   02/25/16 1115  ciprofloxacin (CIPRO) IVPB 400 mg     400 mg 200 mL/hr over 60 Minutes Intravenous On call to O.R. 02/25/16 1106 02/25/16 1308         Assessment/Plan Ileocolonic intussusception, colorectal adenocarcinoma POD#6 Open right colectomy---Dr. Harlow Asa -encourage PO pain meds, resume home meds, mobilize  -  pathology has been discussed, OP oncology follow up, will have office refer  FEN-pain with PO intake.  Continue colace and miralax.  Give dulcolax suppository VTE prophylaxis-SCD/lovenox Dispo-anticipate DC 24 hours    Erby Pian, ANP-BC [facility] Surgery Pager [phone](7A-4:30P)   03/02/2016 7:59 AM

## 2016-03-02 NOTE — Discharge Summary (Signed)
Physician Discharge Summary  Mary Andrade RKY:[id number] DOB: 1955-12-23 DOA: 02/24/2016  PCP: London Pepper, MD  Consultation:  none  Admit date: 02/24/2016 Discharge date: 03/02/2016  Recommendations for Outpatient Follow-up:   Follow-up Information    Follow up with [name], MD On 03/23/2016.   Specialty:  General Surgery   Why:  appt time 10:15AM post operative check up with the surgeon.  our office will call you for a referral to the cancer center.  please call us if you do not hear anything by thursday.    Contact information:   [address] [place] [address] [phone]       Follow up with London Pepper, MD In 2 weeks.   Specialty:  Family Medicine   Contact information:   [place] [address] [phone]       Follow up with [facility] On 03/04/2016.   Specialty:  General Surgery   Why:  10AM to have staples removed.    Contact information:   [place] [address] [phone]      Discharge Diagnoses:  1. Ileocolonic intussusception 2. Colorectal adenocarcinoma    Surgical Procedure: Open right colectomy---Dr. Harlow Asa  Discharge Condition: stable Disposition: home  Diet recommendation: regular  Filed Weights   02/24/16 1340  Weight: 69.854 kg (154 lb)       Hospital Course:  Mary Andrade is a 59 year old female who presented to [facility] with abdominal pain. CT scan revealed a ileocolonic intussusception.  She underwent a open right colectomy by Dr. Harlow Asa.  She tolerated the procedure well and was transferred to the floor.  Diet was advanced as ileus resolved.  She was maintained on SCDs and lovenox.  On POD#6 she was having BMs, ambulating, pain well controlled, VSS, afebrile and therefore felt stable for discharge home.  Follow up to have staples removed and a post operative check up.  Our office is working on the referral to oncology. She was encouraged to  call with questions or concerns.  Medication risks, benefits and therapeutic alternatives were reviewed with the patient.  She verbalizes understanding.     Discharge Instructions     Medication List    STOP taking these medications        ondansetron 4 MG tablet  Commonly known as:  ZOFRAN     oxyCODONE-acetaminophen 5-325 MG tablet  Commonly known as:  PERCOCET      TAKE these medications        ALPRAZolam 0.25 MG tablet  Commonly known as:  XANAX  Take 0.25 mg by mouth 3 (three) times daily as needed for anxiety or sleep.     cholecalciferol 1000 units tablet  Commonly known as:  VITAMIN D  Take 2,000 Units by mouth daily.     docusate sodium 100 MG capsule  Commonly known as:  COLACE  Take 1 capsule (100 mg total) by mouth 2 (two) times daily.     esomeprazole 40 MG capsule  Commonly known as:  NEXIUM  Take 40 mg by mouth daily at 12 noon.     ferrous sulfate 325 (65 FE) MG EC tablet  Take 325 mg by mouth daily.     HYDROcodone-acetaminophen 5-325 MG tablet  Commonly known as:  NORCO/VICODIN  Take 1-2 tablets by mouth every 4 (four) hours as needed for moderate pain or severe pain.     ibuprofen 200 MG tablet  Commonly  known as:  ADVIL,MOTRIN  Take 400 mg by mouth every 6 (six) hours as needed for moderate pain.     polyethylene glycol packet  Commonly known as:  MIRALAX / GLYCOLAX  Take 17 g by mouth daily.     PROBIOTIC DAILY PO  Take 1 tablet by mouth daily.     vitamin E 200 UNIT capsule  Take 400 Units by mouth daily.           Follow-up Information    Follow up with [name], MD In 2 weeks.   Specialty:  General Surgery   Why:  post operative check up.  our office will call you for a referral to the cancer center.  please call us if you do not hear anything by thursday.    Contact information:   [address] [place] [address] [phone]       Follow up with London Pepper, MD In 2 weeks.   Specialty:  Family  Medicine   Contact information:   [place] [address] [phone]        The results of significant diagnostics from this hospitalization (including imaging, microbiology, ancillary and laboratory) are listed below for reference.    Significant Diagnostic Studies: Dg Chest 2 View  02/25/2016  CLINICAL DATA:  Preoperative evaluation for upcoming collected, cough EXAM: CHEST  2 VIEW COMPARISON:  01/24/2015 FINDINGS: Cardiac shadow is within normal limits. The lungs are well aerated bilaterally. No focal infiltrate or sizable effusion is seen. The osseous structures are within normal limits. IMPRESSION: No active cardiopulmonary disease. Electronically Signed   By: Inez Catalina M.D.   On: 02/25/2016 09:43   Ct Abdomen Pelvis W Contrast  02/24/2016  CLINICAL DATA:  Right lower quadrant abdominal pain for 2 days. Nausea. EXAM: CT ABDOMEN AND PELVIS WITH CONTRAST TECHNIQUE: Multidetector CT imaging of the abdomen and pelvis was performed using the standard protocol following bolus administration of intravenous contrast. CONTRAST:  153m ISOVUE-300 IOPAMIDOL (ISOVUE-300) INJECTION 61% COMPARISON:  08/13/2015 FINDINGS: Lower chest: Dependent subsegmental atelectasis in both lower lobes. Hepatobiliary: Unremarkable Pancreas: Unremarkable Spleen: Unremarkable Adrenals/Urinary Tract: Unremarkable Stomach/Bowel: Ileocolic intussusception with 7.8 cm excursion. Interestingly, the appendix is incorporated into the intussusception which also includes adipose tissue difficult to exclude wall thickening in the cecum. Dilution of contrast in the distal ileum which is mildly dilated. There is edema surrounding the intussusception site. The appendix measures up to 8 mm in thickness, mildly abnormally thickened. Vascular/Lymphatic: Aortoiliac atherosclerotic vascular disease. 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 IMPRESSION: 1. Ileocolic intussusception measuring about 7.8 cm in length, also incorporating the slightly thickened appendix, with a small amount of ascites surrounding the intussusceptions site and also down in the pelvis. I am doubtful of acute appendicitis although given the slight wall thickening of the appendix, appendicitis is not totally excluded. Intussusception and adult is frequently accompanied by an underlying lesion, and it may be prudent to screen the colon after a prep per reduction, particularly the cecum, to ensure that there is not an underlying occult mass. 2. 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 3.  Aortoiliac atherosclerotic vascular disease. Electronically Signed   By: WVan ClinesM.D.   On: 02/24/2016 18:03   Mm Screening Breast Tomo Bilateral  02/04/2016  CLINICAL DATA:  Screening. EXAM: 2D DIGITAL SCREENING BILATERAL MAMMOGRAM WITH CAD AND ADJUNCT TOMO COMPARISON:  Previous exam(s). ACR Breast Density Category b: There are scattered areas of fibroglandular density. FINDINGS: There are no findings suspicious for malignancy. Images were processed with CAD. IMPRESSION: No mammographic evidence of malignancy. A result letter of this screening mammogram will be mailed directly to the patient. RECOMMENDATION: Screening mammogram in one year. (Code:SM-B-01Y) BI-RADS CATEGORY  1: Negative. Electronically Signed   By: Abelardo Diesel M.D.   On: 02/04/2016 08:34    Microbiology: Recent Results (from the past 240 hour(s))  Surgical pcr screen     Status: None   Collection Time: 02/25/16  9:19 AM  Result Value Ref Range Status   MRSA, PCR NEGATIVE NEGATIVE Final   Staphylococcus aureus NEGATIVE NEGATIVE Final    Comment:        The Xpert SA Assay (FDA approved for NASAL specimens in patients over 49 years of age), is  one component of a comprehensive surveillance program.  Test performance has been validated by [facility] for patients greater than or equal to 10 year old. It is not intended to diagnose infection nor to guide or monitor treatment.      Labs: Basic Metabolic Panel:  Recent Labs Lab 02/24/16 1535 02/25/16 1609 02/26/16 0454  NA 136  --  137  K 3.3*  --  4.3  CL 107  --  102  CO2 24  --  25  GLUCOSE 101*  --  150*  BUN 8  --  7  CREATININE 0.66 0.65 0.65  CALCIUM 8.7*  --  8.4*   Liver Function Tests:  Recent Labs Lab 02/24/16 1535  AST 18  ALT 9*  ALKPHOS 57  BILITOT 0.7  PROT 6.4*  ALBUMIN 3.4*    Recent Labs Lab 02/24/16 1535  LIPASE 19   No results for input(s): AMMONIA in the last 168 hours. CBC:  Recent Labs Lab 02/24/16 1535 02/25/16 1609 02/26/16 0454  WBC 11.9* 13.5* 12.1*  NEUTROABS 9.2*  --   --   HGB 13.2 12.8 11.6*  HCT 38.3 38.8 35.3*  MCV 84.5 86.6 86.7  PLT 225 215 231   Cardiac Enzymes: No results for input(s): CKTOTAL, CKMB, CKMBINDEX, TROPONINI in the last 168 hours. BNP: BNP (last 3 results) No results for input(s): BNP in the last 8760 hours.  ProBNP (last 3 results) No results for input(s): PROBNP in the last 8760 hours.  CBG: No results for input(s): GLUCAP in the last 168 hours.  Active Problems:   Intussusception ([place])   Intussusception of colon ([place])   Intussusception, ileocecal ([place])   Time coordinating discharge: <30 mins   Signed:  Amerie Beaumont, ANP-BC

## 2016-03-04 ENCOUNTER — Telehealth: Payer: Self-pay | Admitting: *Deleted

## 2016-03-04 NOTE — Telephone Encounter (Signed)
Oncology Nurse Navigator Documentation  Oncology Nurse Navigator Flowsheets 03/04/2016  Navigator Location [facility]  Navigator Encounter Type Introductory phone call  Abnormal Finding Date 02/24/2016  Confirmed Diagnosis Date 02/25/2016  Surgery Date 02/25/2016   Spoke with patient and provided new patient appointment for 03/16/16 at 1:45/2:00 with Dr. Julieanne Manson. Informed of location of [place], valet service, and registration process. Reminded to bring insurance cards and a current medication list, including supplements. Patient verbalizes understanding. Notified HIM to enter appointment into EPIC.

## 2016-03-05 ENCOUNTER — Encounter (HOSPITAL_COMMUNITY): Payer: Self-pay

## 2016-03-05 NOTE — Telephone Encounter (Signed)
Message to HIM to enter 03/16/16 2pm visit with Dr. Benay Spice in [facility].

## 2016-03-08 ENCOUNTER — Encounter (HOSPITAL_COMMUNITY): Payer: Self-pay

## 2016-03-16 ENCOUNTER — Telehealth: Payer: Self-pay | Admitting: Oncology

## 2016-03-16 ENCOUNTER — Encounter: Payer: Self-pay | Admitting: *Deleted

## 2016-03-16 ENCOUNTER — Ambulatory Visit (HOSPITAL_BASED_OUTPATIENT_CLINIC_OR_DEPARTMENT_OTHER): Payer: Medicare Other | Admitting: Oncology

## 2016-03-16 VITALS — BP 121/78 | HR 62 | Temp 97.8°F | Resp 18 | Ht 66.0 in | Wt 153.7 lb

## 2016-03-16 DIAGNOSIS — G8929 Other chronic pain: Secondary | ICD-10-CM | POA: Diagnosis not present

## 2016-03-16 DIAGNOSIS — M549 Dorsalgia, unspecified: Secondary | ICD-10-CM

## 2016-03-16 DIAGNOSIS — C180 Malignant neoplasm of cecum: Secondary | ICD-10-CM | POA: Diagnosis not present

## 2016-03-16 DIAGNOSIS — R109 Unspecified abdominal pain: Secondary | ICD-10-CM

## 2016-03-16 DIAGNOSIS — F419 Anxiety disorder, unspecified: Secondary | ICD-10-CM

## 2016-03-16 DIAGNOSIS — G629 Polyneuropathy, unspecified: Secondary | ICD-10-CM

## 2016-03-16 DIAGNOSIS — C182 Malignant neoplasm of ascending colon: Secondary | ICD-10-CM | POA: Insufficient documentation

## 2016-03-16 NOTE — Patient Instructions (Signed)
   Care Plan Summary- 03/16/2016 Name:  Mary Andrade     DOB: [birth year] Your Medical Team:  Medical Oncologist:  Dr. Ma Rings Radiation Oncologist:   Surgeon:   Dr. Armandina Gemma Type of Cancer: Colon Cancer-adenocarcinoma  Stage/Grade: Stage II *Exact staging of your cancer is based on size of the tumor, depth of invasion, involvement of lymph nodes or not, and whether or not the cancer has spread beyond the primary site.   Recommendations: Based on information available as of today's consult. Recommendations may change depending on the results of further tests or exams. 1) CT scan of chest and CEA tumor marker lab test to complete staging 2) Xeloda (chemo pill) 2 weeks on, then 1 week off X 6 months 3) Return to Dr. Benay Spice on 6/6 to discuss decison ______________________________________________________________________________ Next Steps: 1) Schedule chemo class and labs for 03/22/16 2) Radiology will call with CT scan date/time 3) Follow up with Dr. Harlow Asa 03/23/16 at 10:15 as scheduled  Questions? Merceda Elks, RN, BSN at [phone]. Manuela Schwartz is your Oncology Nurse Navigator and is available to assist you while you're receiving your medical care at [facility].

## 2016-03-16 NOTE — Progress Notes (Signed)
Oncology Nurse Navigator Documentation  Oncology Nurse Navigator Flowsheets 03/16/2016  Navigator Location [facility]  Navigator Encounter Type Initial MedOnc  Abnormal Finding Date -  Confirmed Diagnosis Date -  Surgery Date -  Patient Visit Type MedOnc;Initial  Treatment Phase Pre-Tx/Tx Discussion  Barriers/Navigation Needs Education;Family concerns;Coordination of Care  Education Understanding Cancer/ Treatment Options;Coping with Diagnosis/ Prognosis;Newly Diagnosed Cancer Education;Preparing for Upcoming Treatment  Interventions Coordination of Care;Education Method  Coordination of Care Radiology--CT scan scheduled for 6/5 at 12:15/12:30  Education Method Verbal;Written;Teach-back  Support Groups/Services GI Support Group;[place];Other--[facility] Support Services  Acuity Level 2  Time Spent with Patient 71  Met with patient and husband during new patient visit. Explained the role of the GI Nurse Navigator and provided New Patient Packet with information on: 1. Colon  Cancer with info on CEA test and information on Xeloda 2. Support groups 3. Advanced Directives 4. Fall Safety Plan Answered questions, reviewed current treatment plan using TEACH back and provided emotional support. Provided copy of current treatment plan. Mary Andrade is very anxious about taking chemotherapy. A huge concern was the size of the chemo pills-she was shown a photo of actual size, but feels she can take them per her usual procedure of swallowing with bit of olive oil. She reports applesauce does not work for her. She has agreed to go to chemo class to learn more about medication before her final decision. Encouraged her to think about things and make best choice she will be comfortable with-the oral Xeloda could increase her chance for cure to about 85% per MD from 75-80% with surgery alone. If her anxiety level persists and she chooses chemo, will offer referral to [facility] or  counselor for anxiety management. No dietary needs at this time are noted.  Mary Elks, RN, BSN GI Oncology [place]

## 2016-03-16 NOTE — Telephone Encounter (Signed)
Gave and printed appt sched and avs for pt for June °

## 2016-03-16 NOTE — Progress Notes (Signed)
[place] Patient Consult   Referring MD: Renelda Kilian Herbst 60 y.o.  1955-12-30    Reason for Referral: Colon cancer   HPI: Mr. Biswell developed severe right abdomen pain in November 2016. Mary Andrade was seen in the emergency room. A CT abdomen showed no acute abnormality.  Her pain improved. Mary Andrade had intermittent abdominal pain over the next few months. The pain became severe on 02/24/2016. Mary Andrade again presented to the emergency room. A CT of the abdomen and pelvis revealed an ileocolic intussusception incorporating the appendix. A small amount of ascites surrounding the distal dissection site. The liver, pancreas, adrenal glands, and urinary tract were unremarkable.  Mary Andrade was admitted. Dr. Harlow Asa was consulted and Mary Andrade was taken to the operating room on 02/25/2016 for a right colectomy. Moderate ascites was noted. The cecum appeared grossly abnormal. Tissue was edematous along the right colon extending into the retroperitoneum. The terminal ileum was mildly dilated and several centimeters of small bowel were within the right colon. A right colectomy was performed with a side-to-side anastomosis between the ileum and transverse colon.  The pathology ([id number]) revealed a 6.8 cm adenocarcinoma of the cecum. Tumor extends into pericolonic connective tissue. The resection margins were negative. 19 benign lymph nodes. No lymphovascular or perineural invasion. Acute inflammation was noted in the area of tumor, appendix, and pericecal connective tissue with focal necrosis. There was loss of MLH1 and PMS2 mismatch repair protein expression. The tumor returned microsatellite instability-high. A BRAF V600E mutation was detected.  The pain Mary Andrade experienced prior to surgery has resolved. Mary Andrade continues to have abdominal soreness following surgery.  Past Medical History  Diagnosis Date  . Diabetes mellitus without complication ([place])   . Hyperlipidemia     .  G4, P2, Ab2   .  "Hip  "bursitis   .  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  Chronic anxiety   .  Gastroesophageal reflux disease   .  Chronic back pain   .  Neuropathy  Past Surgical History  Procedure Laterality Date  . Spinal surgeries  2003, 2004, 2005  . Partial colectomy Right 02/25/2016    Procedure: OPEN RIGHT COLECTOMY;  Surgeon: Armandina Gemma, MD;  Location: [facility];  Service: General;  Laterality: Right;    Medications: Reviewed  Allergies:  Allergies  Allergen Reactions  . Amoxicillin Other (See Comments)    With prednisone?  Caused Chest pains, abdominal pains PCN reaction causing immediate rash, facial/tongue/throat swelling, SOB or lightheadedness with hypotension: NO PCN reaction causing severe rash involving mucus membranes or skin necrosis: NO PCN reaction that required hospitalization: NO PCN reaction occurring within the last 10 years: NO If all of the above answers are "NO", then may proceed with Cephalosporin use.   . Pregabalin Other (See Comments)    Could not lift head  . Amoxicillin-Pot Clavulanate Other (See Comments)    Side pain  . Barium-Containing Compounds Other (See Comments)    GI tract, facial numbness, constipation  . Gadolinium Derivatives Other (See Comments)    For MRI, most recently for head scan, had no issues.   . Pantoprazole Other (See Comments)    Cannot swallow  . Topamax [Topiramate] Other (See Comments)    Hair loss  . Morphine And Related Itching    Family history: Father had lung cancer and was a smoker. Mary Andrade is an only child. No other family history of cancer.  Social History:  Mary Andrade lives in [place]. 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 Mary Andrade has smoked one pack of cigarettes per day for at least 40 years. Mary Andrade quit smoking cigarettes 21 days ago. No alcohol use. No transfusion history. No risk factor for HIV or hepatitis.  History  Alcohol Use No    History  Smoking  status  . Current Every Day Smoker -- 1.00 packs/day  . Types: Cigarettes  Smokeless tobacco  . Not on file      ROS:   Positives include:Solid dysphagia and regurgitation for years, reflux symptoms, intermittent left facial numbness, intermittent mouth burning, left foot numbness following spine surgery, abdominal soreness following the right colectomy  A complete ROS was otherwise negative.  Physical Exam:  Blood pressure 121/78, pulse 62, temperature 97.8 F (36.6 C), temperature source Oral, resp. rate 18, height 5' 6"  (1.676 m), weight 153 lb 11.2 oz (69.718 kg), SpO2 99 %.  HEENT: Oropharynx without visible mass, neck without mass Lungs: Clear bilaterally Cardiac: Regular rate and rhythm Abdomen: Mild tenderness in the right upper abdomen, no hepatosplenomegaly, healed surgical incision, no mass  Vascular: No leg edema Lymph nodes: No cervical, supra-clavicular, axillary, or inguinal nodes Neurologic: Alert and oriented, the motor exam appears intact in the upper and lower extremities Skin: No rash, multiple tattoos Musculoskeletal: No spine tenderness   LAB:  CBC  Lab Results  Component Value Date   WBC 12.1* 02/26/2016   HGB 11.6* 02/26/2016   HCT 35.3* 02/26/2016   MCV 86.7 02/26/2016   PLT 231 02/26/2016   NEUTROABS 9.2* 02/24/2016     CMP      Component Value Date/Time   NA 137 02/26/2016 0454   K 4.3 02/26/2016 0454   CL 102 02/26/2016 0454   CO2 25 02/26/2016 0454   GLUCOSE 150* 02/26/2016 0454   BUN 7 02/26/2016 0454   CREATININE 0.65 02/26/2016 0454   CALCIUM 8.4* 02/26/2016 0454   PROT 6.4* 02/24/2016 1535   ALBUMIN 3.4* 02/24/2016 1535   AST 18 02/24/2016 1535   ALT 9* 02/24/2016 1535   ALKPHOS 57 02/24/2016 1535   BILITOT 0.7 02/24/2016 1535   GFRNONAA >60 02/26/2016 0454   GFRAA >60 02/26/2016 0454      Imaging:  Chest x-ray 02/25/2016-no active cardio pole or disease CT abdomen-pelvis 02/24/2016-images reviewed with Mary Andrade  her husband   Assessment/Plan:   1. 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   Right abdomen pain secondary to #1  3.   Chronic back pain  4.    Diabetes  5.    Anxiety  6.    Neuropathy  7.    Chronic intermittent mouth burning and left facial numbness-Mary Andrade reports being followed by neurology   Disposition:  Mary Andrade has been diagnosed with stage II colon cancer after presenting with an intussusception. I reviewed the details of the surgical pathology report and discussed the prognosis with her. We discussed the indication for adjuvant systemic therapy. I described the benefit associated with adjuvant 5-fluorouracil based chemotherapy in patients with resected colon cancer. I explained the controversy surrounding the use of adjuvant therapy in patients with stage II disease.  Her tumor does not have high-risk features such as bowel perforation, limited lymph node sampling, or lymphovascular invasion. However the BRAF mutation and presentation with the intussusception may confer  an inferior prognosis.  We discussed the risks/benefits of capecitabine therapy. We reviewed the potential toxicities associated with capecitabine including the chance for nausea/vomiting, mucositis, alopecia, diarrhea, and hematologic toxicity. We discussed the rash, hyperpigmentation, sun sensitivity, and hand/foot syndrome associated with capecitabine.  The absolute benefit associated with adjuvant chemotherapy her case is predicted to be small. Mary Andrade will attend a chemotherapy class and return next week for further discussion.  We will refer her to gastroenterology for a completion colonoscopy. We will check a CEA and arrange for a staging chest CT.  Approximately 50 minutes were spent with the patient today. The majority of the  time was used for counseling and coordination of care.    Betsy Coder, MD  03/16/2016, 5:45 PM

## 2016-03-17 ENCOUNTER — Telehealth: Payer: Self-pay | Admitting: Oncology

## 2016-03-17 NOTE — Telephone Encounter (Signed)
per pof to sch pt appt-sent referral to  GI to call pt ands sch

## 2016-03-22 ENCOUNTER — Other Ambulatory Visit (HOSPITAL_BASED_OUTPATIENT_CLINIC_OR_DEPARTMENT_OTHER): Payer: Medicare Other

## 2016-03-22 ENCOUNTER — Other Ambulatory Visit: Payer: Medicare Other

## 2016-03-22 ENCOUNTER — Ambulatory Visit (HOSPITAL_COMMUNITY)
Admission: RE | Admit: 2016-03-22 | Discharge: 2016-03-22 | Disposition: A | Discharge status: Home / Self Care | Payer: Medicare Other | Source: Ambulatory Visit | Attending: Oncology | Admitting: Oncology

## 2016-03-22 DIAGNOSIS — C180 Malignant neoplasm of cecum: Secondary | ICD-10-CM | POA: Diagnosis present

## 2016-03-22 DIAGNOSIS — C182 Malignant neoplasm of ascending colon: Secondary | ICD-10-CM

## 2016-03-22 DIAGNOSIS — C189 Malignant neoplasm of colon, unspecified: Secondary | ICD-10-CM | POA: Diagnosis not present

## 2016-03-23 ENCOUNTER — Telehealth: Payer: Self-pay | Admitting: Nurse Practitioner

## 2016-03-23 ENCOUNTER — Ambulatory Visit (HOSPITAL_BASED_OUTPATIENT_CLINIC_OR_DEPARTMENT_OTHER): Payer: Medicare Other | Admitting: Nurse Practitioner

## 2016-03-23 VITALS — BP 131/74 | HR 58 | Temp 97.5°F | Resp 18 | Wt 152.4 lb

## 2016-03-23 DIAGNOSIS — C182 Malignant neoplasm of ascending colon: Secondary | ICD-10-CM

## 2016-03-23 DIAGNOSIS — E119 Type 2 diabetes mellitus without complications: Secondary | ICD-10-CM | POA: Diagnosis not present

## 2016-03-23 DIAGNOSIS — C180 Malignant neoplasm of cecum: Secondary | ICD-10-CM | POA: Diagnosis not present

## 2016-03-23 LAB — CEA: CEA: 1.3 ng/mL (ref 0.0–4.7)

## 2016-03-23 NOTE — Progress Notes (Addendum)
[facility] OFFICE PROGRESS NOTE   Diagnosis:  Colon cancer  INTERVAL HISTORY:   Mary Andrade returns as scheduled. She overall is feeling well. She had a bowel movement this morning. She noted that the stool was "yellow". Overall good appetite. She quit smoking 28 days ago.  Objective:  Vital signs in last 24 hours:  Blood pressure 131/74, pulse 58, temperature 97.5 F (36.4 C), temperature source Oral, resp. rate 18, weight 152 lb 6.4 oz (69.128 kg), SpO2 100 %.    HEENT: No thrush or ulcers. Resp: Lungs clear bilaterally. Cardio: Regular rate and rhythm. GI: Abdomen soft. Mild tenderness. No hepatomegaly. Vascular: No leg edema. Calves soft and nontender.   Lab Results:  Lab Results  Component Value Date   WBC 12.1* 02/26/2016   HGB 11.6* 02/26/2016   HCT 35.3* 02/26/2016   MCV 86.7 02/26/2016   PLT 231 02/26/2016   NEUTROABS 9.2* 02/24/2016    Imaging:  Ct Chest Wo Contrast  03/22/2016  CLINICAL DATA:  Colon cancer, diagnosed 02/2016, for staging EXAM: CT CHEST WITHOUT CONTRAST TECHNIQUE: Multidetector CT imaging of the chest was performed following the standard protocol without IV contrast. COMPARISON:  Chest radiographs dated 02/25/2016 FINDINGS: Mediastinum/Nodes: Heart is normal in size. No pericardial effusion. Mild atherosclerotic calcifications aortic arch. Small mediastinal lymph nodes measuring up to 6 mm short axis, within normal limits. Visualized thyroid is unremarkable. Lungs/Pleura: No suspicious pulmonary nodules. Mild biapical pleural-parenchymal scarring. Mild emphysematous changes, including paraseptal emphysematous changes in the lateral right upper lobe (series 5/image 42). No focal consolidation. No pleural effusion or pneumothorax. Upper abdomen: Visualized upper abdomen is notable for postsurgical changes involving the right colon and vascular calcifications. Musculoskeletal: Mild degenerative changes of the visualized thoracolumbar  spine. IMPRESSION: No evidence of metastatic disease in the chest. Electronically Signed   By: Julian Hy M.D.   On: 03/22/2016 14:48    Medications: I have reviewed the patient's current medications.  Assessment/Plan: 1. 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 Right abdomen pain secondary to #1  3. Chronic back pain  4. Diabetes  5. Anxiety  6. Neuropathy  7. Chronic intermittent mouth burning and left facial numbness-she reports being followed by neurology   Disposition: Mary Andrade appears stable. We reviewed that the chest CT done yesterday was negative for evidence of metastatic disease. We also reviewed the CEA result from yesterday was in normal range. She is here to discuss possible adjuvant Xeloda. Dr. Benay Spice again reviewed the diagnosis, prognosis and treatment options. She understands the BRAF mutation in the setting of an MSI high tumor does not have the same adverse prognostic significance as a positive BRAF mutation in non-MSI high tumors. She understands the absolute benefit associated with adjuvant chemotherapy in her case is predicted to be small. She decided against adjuvant chemotherapy. She will return for a follow-up visit and CEA in approximately 5 months. She will contact the office in the interim with any problems.   Patient seen with Dr. Benay Spice. 25 minutes were spent face-to-face at today's visit with the majority of that time involved in counseling/coordination of care.  Ned Card ANP/GNP-BC   03/23/2016  12:14 PM  This was a shared visit with Ned Card. The staging chest CT was negative  and the CEA is normal. I reviewed additional literature suggesting the BRAF  mutation in patients with MSI-high tumors does not confer a poor prognosis. She therefore appears to have a prognosis similar to an average patient with stage II disease. We discussed the small potential absolute benefit associated with adjuvant capecitabine in her case. She does not wish to receive adjuvant chemotherapy.  Julieanne Manson, M.D.

## 2016-03-23 NOTE — Telephone Encounter (Signed)
per pof to sch pt appt-gave pt copy of avs °

## 2016-04-01 DIAGNOSIS — Z09 Encounter for follow-up examination after completed treatment for conditions other than malignant neoplasm: Secondary | ICD-10-CM | POA: Diagnosis not present

## 2016-04-01 DIAGNOSIS — L659 Nonscarring hair loss, unspecified: Secondary | ICD-10-CM | POA: Diagnosis not present

## 2016-04-01 DIAGNOSIS — C189 Malignant neoplasm of colon, unspecified: Secondary | ICD-10-CM | POA: Diagnosis not present

## 2016-04-01 DIAGNOSIS — K146 Glossodynia: Secondary | ICD-10-CM | POA: Diagnosis not present

## 2016-04-08 ENCOUNTER — Telehealth: Payer: Self-pay | Admitting: Internal Medicine

## 2016-04-08 DIAGNOSIS — L650 Telogen effluvium: Secondary | ICD-10-CM | POA: Diagnosis not present

## 2016-04-08 NOTE — Telephone Encounter (Signed)
Received records from [place]. Patient states that she feels like she wasn't getting the best of care there. Referring Provider is requesting that patient see Dr. Hilarie Fredrickson. Records placed on Dr. Vena Rua desk for review.

## 2016-04-12 ENCOUNTER — Encounter: Payer: Self-pay | Admitting: Internal Medicine

## 2016-04-12 NOTE — Telephone Encounter (Signed)
Per Dr. Hilarie Fredrickson, pt is due for a recall colon in April 2018 unless she is having problems and would need to schedule an OV.  Spoke w/patient and she scheduled a OV for 06-18-16 bc she feels like she needs a colonoscopy sooner according to Dr. Benay Spice

## 2016-05-11 DIAGNOSIS — L650 Telogen effluvium: Secondary | ICD-10-CM | POA: Diagnosis not present

## 2016-05-14 ENCOUNTER — Encounter (HOSPITAL_COMMUNITY): Payer: Self-pay

## 2016-05-31 ENCOUNTER — Encounter: Payer: Self-pay | Admitting: *Deleted

## 2016-05-31 DIAGNOSIS — M7731 Calcaneal spur, right foot: Secondary | ICD-10-CM | POA: Diagnosis not present

## 2016-06-16 DIAGNOSIS — L821 Other seborrheic keratosis: Secondary | ICD-10-CM | POA: Diagnosis not present

## 2016-06-16 DIAGNOSIS — L650 Telogen effluvium: Secondary | ICD-10-CM | POA: Diagnosis not present

## 2016-06-18 ENCOUNTER — Encounter (INDEPENDENT_AMBULATORY_CARE_PROVIDER_SITE_OTHER): Payer: Self-pay

## 2016-06-18 ENCOUNTER — Encounter: Payer: Self-pay | Admitting: Internal Medicine

## 2016-06-18 ENCOUNTER — Ambulatory Visit (INDEPENDENT_AMBULATORY_CARE_PROVIDER_SITE_OTHER): Payer: Medicare Other | Admitting: Internal Medicine

## 2016-06-18 VITALS — BP 112/78 | HR 72 | Ht 67.0 in | Wt 163.0 lb

## 2016-06-18 DIAGNOSIS — K219 Gastro-esophageal reflux disease without esophagitis: Secondary | ICD-10-CM | POA: Diagnosis not present

## 2016-06-18 DIAGNOSIS — R1310 Dysphagia, unspecified: Secondary | ICD-10-CM

## 2016-06-18 DIAGNOSIS — Z85038 Personal history of other malignant neoplasm of large intestine: Secondary | ICD-10-CM

## 2016-06-18 MED ORDER — METOCLOPRAMIDE HCL 10 MG PO TABS
ORAL_TABLET | ORAL | 0 refills | Status: DC
Start: 2016-06-18 — End: 2016-08-18

## 2016-06-18 NOTE — Patient Instructions (Addendum)
You have been scheduled for an endoscopy and colonoscopy. Please follow the written instructions given to you at your visit today. Please pick up your prep supplies at the pharmacy within the next 1-3 days. If you use inhalers (even only as needed), please bring them with you on the day of your procedure. Your physician has requested that you go to www.startemmi.com and enter the access code given to you at your visit today. This web site gives a general overview about your procedure. However, you should still follow specific instructions given to you by our office regarding your preparation for the procedure.  1 week prior to colonoscopy, please use your miralax.

## 2016-06-18 NOTE — Progress Notes (Signed)
HPI: Mary Andrade is a 60 year old female with a history of stage II adenocarcinoma of the cecum status post right hemicolectomy on 02/25/2016, GERD with history of dysphagia who is seen in consultation at the request of Dr. Benay Spice to discuss surveillance colonoscopy and also GERD with dysphagia. She is here today with her husband. She also has a history of lumbar disc disease, diabetes, hyperlipidemia.  The patient reports in May she developed acute abdominal pain and intussusception which was secondary to a large cecal tumor. She was hospitalized and underwent a right colectomy with Dr. Harlow Asa on 02/25/2016. This revealed a 6.8 cm adenocarcinoma of the colon with 19 benign lymph nodes. Margins were not involved. MSI was high and she was found to have a positive BRAF V600E mutation. She has met with oncology after recovering from surgery and the decision was made not to pursue chemotherapy.  Currently she reports that she is feeling well. She has some issues with mild constipation having a bowel movement about every 3 days. She has not seen blood in her stool or melena. She still has some abdominal soreness but not pain. She has changed her diet dramatically and is now eating a plant-based diet and avoiding meats. She has gained some weight which she attributes to dietary change.  She does report long-standing reflux disease and more recently issues with mild dysphagia. At times she feels like she "can't swallow" with food "didn't finish" getting to her stomach. She is taking Nexium 40 mg daily and at times still having breakthrough heartburn. Without Nexium she has severe heartburn, sour brash and regurgitation. She denies nausea and vomiting.  No known family history of colorectal cancer  She reports at age 78 having upper endoscopy and screening colonoscopy performed in [place]. She reports her colonoscopy was normal. She was told she had reflux but doesn't recall having esophageal  dilation.  Past Medical History:  Diagnosis Date  . Allergic rhinitis   . Anxiety   . Diabetes mellitus without complication ([place])   . GERD (gastroesophageal reflux disease)   . Hyperlipidemia   . Ileal intussusception ([place])   . Pars defect    L4 w/12 mm of anterolisthesis    Past Surgical History:  Procedure Laterality Date  . PARTIAL COLECTOMY Right 02/25/2016   Procedure: OPEN RIGHT COLECTOMY;  Surgeon: Armandina Gemma, MD;  Location: [facility];  Service: General;  Laterality: Right;  . PERIPHERALLY INSERTED CENTRAL CATHETER INSERTION  2003  . SPINAL FUSION  2003   with screws; later removal of screws 2004  . SPINAL SURGERIES  2003, 2004, 2005  . WISDOM TOOTH EXTRACTION      Outpatient Medications Prior to Visit  Medication Sig Dispense Refill  . ALPRAZolam (XANAX) 0.25 MG tablet Take 0.25 mg by mouth 3 (three) times daily as needed for anxiety or sleep.     Marland Kitchen esomeprazole (NEXIUM) 40 MG capsule Take 40 mg by mouth daily at 12 noon.    Marland Kitchen ibuprofen (ADVIL,MOTRIN) 200 MG tablet Take 400 mg by mouth every 6 (six) hours as needed for moderate pain.    . NON FORMULARY Plant source iron- Curry derivative    . Omega-3 Fatty Acids (OMEGA III EPA+DHA PO) Take by mouth.    . Probiotic Product (PROBIOTIC DAILY PO) Take 1 tablet by mouth daily.     . vitamin E 200 UNIT capsule Take 400 Units by mouth daily.    . cholecalciferol (VITAMIN D) 1000 units tablet Take 5,000 Units by mouth  daily.     . docusate sodium (COLACE) 100 MG capsule Take 1 capsule (100 mg total) by mouth 2 (two) times daily. 10 capsule 0  . HYDROcodone-acetaminophen (NORCO/VICODIN) 5-325 MG tablet Take 1-2 tablets by mouth every 4 (four) hours as needed for moderate pain or severe pain. 40 tablet 0  . polyethylene glycol (MIRALAX / GLYCOLAX) packet Take 17 g by mouth daily. 14 each 0  . vitamin C (ASCORBIC ACID) 250 MG tablet Take 250 mg by mouth daily.     No facility-administered medications prior to visit.      Allergies  Allergen Reactions  . Amoxicillin Other (See Comments)    With prednisone?  Caused Chest pains, abdominal pains PCN reaction causing immediate rash, facial/tongue/throat swelling, SOB or lightheadedness with hypotension: NO PCN reaction causing severe rash involving mucus membranes or skin necrosis: NO PCN reaction that required hospitalization: NO PCN reaction occurring within the last 10 years: NO If all of the above answers are "NO", then may proceed with Cephalosporin use.   . Pregabalin Other (See Comments)    Could not lift head  . Amoxicillin-Pot Clavulanate Other (See Comments)    Side pain  . Barium-Containing Compounds Other (See Comments)    GI tract, facial numbness, constipation  . Gadolinium Derivatives Other (See Comments)    For MRI, most recently for head scan, had no issues.   . Pantoprazole Other (See Comments)    Cannot swallow  . Topamax [Topiramate] Other (See Comments)    Hair loss  . Adhesive [Tape]   . Morphine And Related Itching    Family History  Problem Relation Age of Onset  . Lung cancer Father   . Diabetes Father   . Colon cancer Neg Hx   . Colon polyps Neg Hx   . Liver disease Neg Hx   . Kidney disease Neg Hx     Social History  Substance Use Topics  . Smoking status: Former Smoker    Packs/day: 1.00    Types: Cigarettes    Quit date: 02/24/2016  . Smokeless tobacco: Never Used  . Alcohol use No    ROS: As per history of present illness, otherwise negative  BP 112/78   Pulse 72   Ht 5' 7"  (1.702 m)   Wt 163 lb 0.2 oz (73.9 kg)   BMI 25.53 kg/m  Constitutional: Well-developed and well-nourished. No distress. HEENT: Normocephalic and atraumatic. Oropharynx is clear and moist. No oropharyngeal exudate. Conjunctivae are normal.  No scleral icterus. Neck: Neck supple. Trachea midline. Cardiovascular: Normal rate, regular rhythm and intact distal pulses.  Pulmonary/chest: Effort normal and breath sounds normal. No  wheezing, rales or rhonchi. Abdominal: Soft, nontender, nondistended. Bowel sounds active throughout. There are no masses palpable. No hepatosplenomegaly. Well-healed transverse right middle abdominal scar Extremities: no clubbing, cyanosis, or edema Lymphadenopathy: No cervical adenopathy noted. Neurological: Alert and oriented to person place and time. Skin: Skin is warm and dry. No rashes noted. Multiple tattoos Psychiatric: Normal mood and affect. Behavior is normal.  RELEVANT LABS AND IMAGING: CBC    Component Value Date/Time   WBC 12.1 (H) 02/26/2016 0454   RBC 4.07 02/26/2016 0454   HGB 11.6 (L) 02/26/2016 0454   HCT 35.3 (L) 02/26/2016 0454   PLT 231 02/26/2016 0454   MCV 86.7 02/26/2016 0454   MCH 28.5 02/26/2016 0454   MCHC 32.9 02/26/2016 0454   RDW 14.8 02/26/2016 0454   LYMPHSABS 1.6 02/24/2016 1535   MONOABS 1.0 02/24/2016 1535  EOSABS 0.0 02/24/2016 1535   BASOSABS 0.0 02/24/2016 1535    CMP     Component Value Date/Time   NA 137 02/26/2016 0454   K 4.3 02/26/2016 0454   CL 102 02/26/2016 0454   CO2 25 02/26/2016 0454   GLUCOSE 150 (H) 02/26/2016 0454   BUN 7 02/26/2016 0454   CREATININE 0.65 02/26/2016 0454   CALCIUM 8.4 (L) 02/26/2016 0454   PROT 6.4 (L) 02/24/2016 1535   ALBUMIN 3.4 (L) 02/24/2016 1535   AST 18 02/24/2016 1535   ALT 9 (L) 02/24/2016 1535   ALKPHOS 57 02/24/2016 1535   BILITOT 0.7 02/24/2016 1535   GFRNONAA >60 02/26/2016 0454   GFRAA >60 02/26/2016 0454    ASSESSMENT/PLAN: 60 year old female with a history of stage II adenocarcinoma of the cecum status post right hemicolectomy on 02/25/2016, GERD with history of dysphagia who is seen in consultation at the request of Dr. Benay Spice to discuss surveillance colonoscopy and also GERD with dysphagia.  1. Cecal cancer, stage II status post right hemicolectomy -- her cancer was diagnosed by intussusception and she went straight to surgery without having colonoscopy. For this reason I have  recommended colonoscopy at this time to evaluate her remaining colon and screen for polyps/cancer. We discussed the risk, benefits and alternatives to repeat colonoscopy and she wishes to proceed. She did have genetic mutation, and may need more frequent colon screenings. I will discuss this with Dr. Benay Spice, particularly as relates to possible Lynch syndrome. --Plan MiraLAX prep with Reglan 10 mg before each half of the prep  2. GERD with dysphagia -- upper endoscopy recommended with possible dilation depending on findings. We discussed the risks, benefits and alternatives and she wishes to proceed. I recommended she continue Nexium 40 mg daily 30 minutes before breakfast. She can use ranitidine 75-150 mg in the evening if needed for breakthrough symptoms.   VH:QION B [address], [place], [place] [address]

## 2016-07-01 DIAGNOSIS — R946 Abnormal results of thyroid function studies: Secondary | ICD-10-CM | POA: Diagnosis not present

## 2016-07-01 DIAGNOSIS — C189 Malignant neoplasm of colon, unspecified: Secondary | ICD-10-CM | POA: Diagnosis not present

## 2016-07-01 DIAGNOSIS — F419 Anxiety disorder, unspecified: Secondary | ICD-10-CM | POA: Diagnosis not present

## 2016-07-01 DIAGNOSIS — M810 Age-related osteoporosis without current pathological fracture: Secondary | ICD-10-CM | POA: Diagnosis not present

## 2016-07-01 DIAGNOSIS — M2550 Pain in unspecified joint: Secondary | ICD-10-CM | POA: Diagnosis not present

## 2016-07-01 DIAGNOSIS — K219 Gastro-esophageal reflux disease without esophagitis: Secondary | ICD-10-CM | POA: Diagnosis not present

## 2016-07-01 DIAGNOSIS — Z79899 Other long term (current) drug therapy: Secondary | ICD-10-CM | POA: Diagnosis not present

## 2016-07-01 DIAGNOSIS — E785 Hyperlipidemia, unspecified: Secondary | ICD-10-CM | POA: Diagnosis not present

## 2016-07-01 DIAGNOSIS — L659 Nonscarring hair loss, unspecified: Secondary | ICD-10-CM | POA: Diagnosis not present

## 2016-07-01 DIAGNOSIS — R5383 Other fatigue: Secondary | ICD-10-CM | POA: Diagnosis not present

## 2016-08-04 DIAGNOSIS — C19 Malignant neoplasm of rectosigmoid junction: Secondary | ICD-10-CM | POA: Diagnosis not present

## 2016-08-04 DIAGNOSIS — Z9049 Acquired absence of other specified parts of digestive tract: Secondary | ICD-10-CM | POA: Diagnosis not present

## 2016-08-17 DIAGNOSIS — M722 Plantar fascial fibromatosis: Secondary | ICD-10-CM | POA: Diagnosis not present

## 2016-08-17 DIAGNOSIS — M7661 Achilles tendinitis, right leg: Secondary | ICD-10-CM | POA: Diagnosis not present

## 2016-08-18 ENCOUNTER — Ambulatory Visit (AMBULATORY_SURGERY_CENTER): Payer: Medicare Other | Admitting: Internal Medicine

## 2016-08-18 ENCOUNTER — Encounter: Payer: Self-pay | Admitting: Internal Medicine

## 2016-08-18 VITALS — BP 123/90 | HR 56 | Temp 99.1°F | Resp 15 | Ht 67.0 in | Wt 163.0 lb

## 2016-08-18 DIAGNOSIS — R1310 Dysphagia, unspecified: Secondary | ICD-10-CM

## 2016-08-18 DIAGNOSIS — Z85038 Personal history of other malignant neoplasm of large intestine: Secondary | ICD-10-CM

## 2016-08-18 DIAGNOSIS — K219 Gastro-esophageal reflux disease without esophagitis: Secondary | ICD-10-CM

## 2016-08-18 DIAGNOSIS — D123 Benign neoplasm of transverse colon: Secondary | ICD-10-CM

## 2016-08-18 DIAGNOSIS — D120 Benign neoplasm of cecum: Secondary | ICD-10-CM

## 2016-08-18 DIAGNOSIS — K3189 Other diseases of stomach and duodenum: Secondary | ICD-10-CM | POA: Diagnosis not present

## 2016-08-18 DIAGNOSIS — D124 Benign neoplasm of descending colon: Secondary | ICD-10-CM

## 2016-08-18 DIAGNOSIS — F419 Anxiety disorder, unspecified: Secondary | ICD-10-CM | POA: Diagnosis not present

## 2016-08-18 DIAGNOSIS — R1319 Other dysphagia: Secondary | ICD-10-CM

## 2016-08-18 DIAGNOSIS — K210 Gastro-esophageal reflux disease with esophagitis: Secondary | ICD-10-CM | POA: Diagnosis not present

## 2016-08-18 MED ORDER — SODIUM CHLORIDE 0.9 % IV SOLN: 500.0000 mL | INTRAVENOUS | Status: AC

## 2016-08-18 MED ORDER — RANITIDINE HCL 150 MG PO CAPS: 150.0000 mg | ORAL_CAPSULE | Freq: Two times a day (BID) | ORAL | 3 refills | Status: AC

## 2016-08-18 NOTE — Progress Notes (Signed)
Pt tearful off and on while in the RR.  When asked, she denies pain.  States, "I just don't want to have cancer."

## 2016-08-18 NOTE — Op Note (Signed)
[place] Patient Name: Mary Andrade Procedure Date: 08/18/2016 8:51 AM MRN: [id number] Endoscopist: Jerene Bears , MD Age: 60 Referring MD:  Date of Birth: 1956-06-12 Gender: Female Account #: [id number] Procedure:                Upper GI endoscopy Indications:              Dysphagia, Gastro-esophageal reflux disease Medicines:                Monitored Anesthesia Care Procedure:                Pre-Anesthesia Assessment:                           - Prior to the procedure, a History and Physical                            was performed, and patient medications and                            allergies were reviewed. The patient's tolerance of                            previous anesthesia was also reviewed. The risks                            and benefits of the procedure and the sedation                            options and risks were discussed with the patient.                            All questions were answered, and informed consent                            was obtained. Prior Anticoagulants: The patient has                            taken no previous anticoagulant or antiplatelet                            agents. ASA Grade Assessment: III - A patient with                            severe systemic disease. After reviewing the risks                            and benefits, the patient was deemed in                            satisfactory condition to undergo the procedure.                           After obtaining informed consent, the endoscope was  passed under direct vision. Throughout the                            procedure, the patient's blood pressure, pulse, and                            oxygen saturations were monitored continuously. The                            Model GIF-[id number] [id number]) scope was introduced                            through the mouth, and advanced to the second part                            of  duodenum. The upper GI endoscopy was                            accomplished without difficulty. The patient                            tolerated the procedure well. Scope In: Scope Out: Findings:                 A non-obstructing and partial Schatzki ring                            (acquired) was found at the gastroesophageal                            junction. This ring was not felt amenable (or                            likely to improve with dilation), thus, multiple                            biopsies were obtained with cold forceps around the                            ring for disruption in a targeted manner.                           The exam of the esophagus was otherwise normal.                           Scattered mild inflammation characterized by                            erosions and granularity was found in the gastric                            body and in the gastric antrum. Biopsies were taken  with a cold forceps for histology and Helicobacter                            pylori testing.                           The cardia and gastric fundus were normal on                            retroflexion.                           The examined duodenum was normal. Complications:            No immediate complications. Estimated Blood Loss:     Estimated blood loss was minimal. Impression:               - Non-obstructing, partial, Schatzki ring. Biopsied.                           - Gastritis. Biopsied.                           - Normal examined duodenum.                           - Multiple biopsies were obtained. Recommendation:           - Patient has a contact number available for                            emergencies. The signs and symptoms of potential                            delayed complications were discussed with the                            patient. Return to normal activities tomorrow.                            Written discharge  instructions were provided to the                            patient.                           - Resume previous diet.                           - Continue present medications.                           - Await pathology results.                           - See the other procedure note for documentation of  additional recommendations. Jerene Bears, MD 08/18/2016 9:36:31 AM This report has been signed electronically.

## 2016-08-18 NOTE — Op Note (Signed)
[facility] Patient Name: Mary Andrade Procedure Date: 08/18/2016 8:50 AM MRN: [id number] Endoscopist: Jerene Bears , MD Age: 60 Referring MD:  Date of Birth: 09/22/1956 Gender: Female Account #: [id number] Procedure:                Colonoscopy Indications:              High risk colon cancer surveillance: Personal                            history of colon cancer diagnosed after                            intussusception in May 2017 s/p right hemicolectomy Medicines:                Monitored Anesthesia Care Procedure:                Pre-Anesthesia Assessment:                           - Prior to the procedure, a History and Physical                            was performed, and patient medications and                            allergies were reviewed. The patient's tolerance of                            previous anesthesia was also reviewed. The risks                            and benefits of the procedure and the sedation                            options and risks were discussed with the patient.                            All questions were answered, and informed consent                            was obtained. Prior Anticoagulants: The patient has                            taken no previous anticoagulant or antiplatelet                            agents. ASA Grade Assessment: III - A patient with                            severe systemic disease. After reviewing the risks                            and benefits, the patient was deemed in  satisfactory condition to undergo the procedure.                           After obtaining informed consent, the colonoscope                            was passed under direct vision. Throughout the                            procedure, the patient's blood pressure, pulse, and                            oxygen saturations were monitored continuously. The                            Model PCF-[id number]  [id number]) scope was introduced                            through the anus and advanced to the the                            ileocolonic anastomosis. The colonoscopy was                            performed without difficulty. The patient tolerated                            the procedure well. The quality of the bowel                            preparation was good. Ileocolonic anastomosis, left                            colon and rectum were photographed. Scope In: 9:05:28 AM Scope Out: 9:25:15 AM Scope Withdrawal Time: 0 hours 14 minutes 58 seconds  Total Procedure Duration: 0 hours 19 minutes 47 seconds  Findings:                 The digital rectal exam findings include                            non-thrombosed internal hemorrhoids, internal                            hemorrhoids that prolapse with straining, but                            spontaneously regress to the resting position                            (Grade II-III). Pertinent negatives include normal                            sphincter tone.  There was evidence of a prior side-to-side                            ileo-colonic anastomosis in the ascending colon.                            This was patent and was characterized by healthy                            appearing mucosa. The anastomosis was traversed.                           The neo-terminal ileum appeared normal.                           A 5 mm polyp was found in the transverse colon. The                            polyp was sessile. The polyp was removed with a                            cold snare. Resection and retrieval were complete.                           A 2 mm polyp was found in the descending colon. The                            polyp was sessile. The polyp was removed with a                            cold biopsy forceps. Resection and retrieval were                            complete.                           Two  sessile polyps were found in the descending                            colon. The polyps were 4 to 5 mm in size. These                            polyps were removed with a cold snare. Resection                            and retrieval were complete.                           Multiple small-mouthed diverticula were found in                            the sigmoid colon.  Internal hemorrhoids were found during retroflexion                            and during perianal exam. The hemorrhoids were                            medium-sized. Complications:            No immediate complications. Estimated Blood Loss:     Estimated blood loss was minimal. Impression:               - Patent side-to-side ileo-colonic anastomosis,                            characterized by healthy appearing mucosa.                           - The examined portion of the ileum was normal.                           - One 5 mm polyp in the transverse colon, removed                            with a cold snare. Resected and retrieved.                           - One 2 mm polyp in the descending colon, removed                            with a cold biopsy forceps. Resected and retrieved.                           - Two 4 to 5 mm polyps in the descending colon,                            removed with a cold snare. Resected and retrieved.                           - Mild diverticulosis in the sigmoid colon.                           - Internal hemorrhoids. Recommendation:           - Patient has a contact number available for                            emergencies. The signs and symptoms of potential                            delayed complications were discussed with the                            patient. Return to normal activities tomorrow.  Written discharge instructions were provided to the                            patient.                           - Resume  previous diet.                           - Continue present medications.                           - Await pathology results.                           - Repeat colonoscopy in 1 year for surveillance. Jerene Bears, MD 08/18/2016 9:42:21 AM This report has been signed electronically.

## 2016-08-18 NOTE — Progress Notes (Signed)
TO PACU  Pt awake and alert. Report to RN

## 2016-08-18 NOTE — Progress Notes (Signed)
Called to room to assist during endoscopic procedure.  Patient ID and intended procedure confirmed with present staff. Received instructions for my participation in the procedure from the performing physician.  

## 2016-08-18 NOTE — Patient Instructions (Addendum)
YOU HAD AN ENDOSCOPIC PROCEDURE TODAY AT [facility]:   Refer to the procedure report that was given to you for any specific questions about what was found during the examination.  If the procedure report does not answer your questions, please call your gastroenterologist to clarify.  If you requested that your care partner not be given the details of your procedure findings, then the procedure report has been included in a sealed envelope for you to review at your convenience later.  YOU SHOULD EXPECT: Some feelings of bloating in the abdomen. Passage of more gas than usual.  Walking can help get rid of the air that was put into your GI tract during the procedure and reduce the bloating. If you had a lower endoscopy (such as a colonoscopy or flexible sigmoidoscopy) you may notice spotting of blood in your stool or on the toilet paper. If you underwent a bowel prep for your procedure, you may not have a normal bowel movement for a few days.  Please Note:  You might notice some irritation and congestion in your nose or some drainage.  This is from the oxygen used during your procedure.  There is no need for concern and it should clear up in a day or so.  SYMPTOMS TO REPORT IMMEDIATELY:   Following lower endoscopy (colonoscopy or flexible sigmoidoscopy):  Excessive amounts of blood in the stool  Significant tenderness or worsening of abdominal pains  Swelling of the abdomen that is new, acute  Fever of 100F or higher   Following upper endoscopy (EGD)  Vomiting of blood or coffee ground material  New chest pain or pain under the shoulder blades  Painful or persistently difficult swallowing  New shortness of breath  Fever of 100F or higher  Black, tarry-looking stools  For urgent or emergent issues, a gastroenterologist can be reached at any hour by calling [phone].   DIET:  We do recommend a small meal at first, but then you may proceed to your regular diet.  Drink  plenty of fluids but you should avoid alcoholic beverages for 24 hours.  ACTIVITY:  You should plan to take it easy for the rest of today and you should NOT DRIVE or use heavy machinery until tomorrow (because of the sedation medicines used during the test).    FOLLOW UP: Our staff will call the number listed on your records the next business day following your procedure to check on you and address any questions or concerns that you may have regarding the information given to you following your procedure. If we do not reach you, we will leave a message.  However, if you are feeling well and you are not experiencing any problems, there is no need to return our call.  We will assume that you have returned to your regular daily activities without incident.  If any biopsies were taken you will be contacted by phone or by letter within the next 1-3 weeks.  Please call us at [phone] if you have not heard about the biopsies in 3 weeks.   SIGNATURES/CONFIDENTIALITY: You and/or your care partner have signed paperwork which will be entered into your electronic medical record.  These signatures attest to the fact that that the information above on your After Visit Summary has been reviewed and is understood.  Full responsibility of the confidentiality of this discharge information lies with you and/or your care-partner.  Next colonoscopy- 1 year  Please read over handouts about gastritis, polyps,  diverticulosis, hemorrhoids and high fiber diets  Please continue your normal medications  Take Nexium per Dr. Quentin Mulling instructions  Take Zantac at night per Dr. Vena Rua instructions

## 2016-08-19 ENCOUNTER — Telehealth: Payer: Self-pay

## 2016-08-19 NOTE — Telephone Encounter (Signed)
Attempted to reach pt. For follow up call following procedure yesterday.   LM on pt.'s ans.machine with our phone no. To call if she has any questions or concerns.   Will try to reach pt. Later today.

## 2016-08-23 ENCOUNTER — Encounter: Payer: Self-pay | Admitting: Internal Medicine

## 2016-08-24 ENCOUNTER — Other Ambulatory Visit (HOSPITAL_BASED_OUTPATIENT_CLINIC_OR_DEPARTMENT_OTHER): Payer: Medicare Other

## 2016-08-24 ENCOUNTER — Telehealth: Payer: Self-pay | Admitting: Oncology

## 2016-08-24 ENCOUNTER — Ambulatory Visit (HOSPITAL_BASED_OUTPATIENT_CLINIC_OR_DEPARTMENT_OTHER): Payer: Medicare Other | Admitting: Oncology

## 2016-08-24 ENCOUNTER — Encounter: Payer: Self-pay | Admitting: *Deleted

## 2016-08-24 VITALS — BP 115/68 | HR 62 | Temp 97.7°F | Resp 18 | Ht 67.0 in | Wt 168.3 lb

## 2016-08-24 DIAGNOSIS — C182 Malignant neoplasm of ascending colon: Secondary | ICD-10-CM | POA: Diagnosis not present

## 2016-08-24 DIAGNOSIS — F17200 Nicotine dependence, unspecified, uncomplicated: Secondary | ICD-10-CM

## 2016-08-24 DIAGNOSIS — G629 Polyneuropathy, unspecified: Secondary | ICD-10-CM | POA: Diagnosis not present

## 2016-08-24 DIAGNOSIS — C180 Malignant neoplasm of cecum: Secondary | ICD-10-CM

## 2016-08-24 DIAGNOSIS — E119 Type 2 diabetes mellitus without complications: Secondary | ICD-10-CM

## 2016-08-24 LAB — CEA (IN HOUSE-CHCC)

## 2016-08-24 NOTE — Telephone Encounter (Signed)
GAVE PATIENT AVS REPORT AND APPOINTMENTS FOR MAY AND APPOINTMENT AT [facility] 11/22.

## 2016-08-24 NOTE — Progress Notes (Signed)
  Mary Andrade OFFICE PROGRESS NOTE   Diagnosis: Colon cancer  INTERVAL HISTORY:   She returns as scheduled. She has multiple complaints including intermittent headaches, facial numbness, burning in the mouth, and malaise. She is being evaluated by Dr. Orland Andrade. She has constipation. She underwent a colonoscopy on 08/18/2016. Polyps were removed from the transverse and descending colon. The pathology returned with sessile serrated adenomatous without dysplasia. An upper endoscopy the same day revealed a Schatzki ring and gastritis.  Objective:  Vital signs in last 24 hours:  Blood pressure 115/68, pulse 62, temperature 97.7 F (36.5 C), temperature source Oral, resp. rate 18, height _0  (1.702 m), weight 168 lb 4.8 oz (76.3 kg), SpO2 100 %.    HEENT: Neck without mass Lymphatics: No cervical, supraclavicular, axillary, or inguinal nodes Resp: Lungs clear bilaterally Cardio: Regular rate and rhythm GI: No hepatosplenomegaly, no mass Vascular: No leg edema   Lab Results:  CEA pending Medications: I have reviewed the patient's current medications.  Assessment/Plan: 1. 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 Right abdomen pain secondary to #1  3. Chronic back pain  4. Diabetes  5. Anxiety  6. Neuropathy  7. Chronic intermittent mouth burning and left facial numbness-she reports being followed by neurology    Disposition:  Mary Andrade is in clinical remission from colon cancer. We will follow-up on the CEA from today. She will return for an office visit and CEA in 6 months. She may relocate to [place]. She will let us know if this occurs and we will help her with finding a  Materials engineer in [place].  She will continue colonoscopy follow-up with gastroenterology.  She plans to continue follow-up with Dr. Orland Andrade for evaluation of her multiple symptoms. I doubt her symptoms are related to the colon cancer diagnosis.  We referred her to the thoracic cancer screening program. She appears to qualify based on her age and history of tobacco use.  Mary Coder, MD  08/24/2016  9:47 AM

## 2016-08-24 NOTE — Progress Notes (Signed)
Oncology Nurse Navigator Documentation  Oncology Nurse Navigator Flowsheets 08/24/2016  Navigator Location [facility]  Navigator Encounter Type Per Dr. Gearldine Shown request, I made a referral to the LDCT screening program.  I also notified nurse navigator in charge of the program, Eric Form on referral.    Patient Visit Type MedOnc  Barriers/Navigation Needs Coordination of Care  Interventions Coordination of Care  Coordination of Care Other  Acuity Level 2  Acuity Level 2 Referrals such as genetics, survivorship  Time Spent with Patient 15

## 2016-08-25 ENCOUNTER — Telehealth: Payer: Self-pay | Admitting: *Deleted

## 2016-08-25 LAB — CEA: CEA1: 0.9 ng/mL (ref 0.0–4.7)

## 2016-08-25 NOTE — Telephone Encounter (Signed)
Per Dr. Benay Spice, patient notified that cea is normal.  Patient appreciative of call and has no questions at this time.

## 2016-08-25 NOTE — Telephone Encounter (Signed)
-----   Message from Ladell Pier, MD sent at 08/25/2016  7:05 AM EST ----- Please call patient, cea is normal

## 2016-08-26 DIAGNOSIS — R946 Abnormal results of thyroid function studies: Secondary | ICD-10-CM | POA: Diagnosis not present

## 2016-08-26 DIAGNOSIS — Z0000 Encounter for general adult medical examination without abnormal findings: Secondary | ICD-10-CM | POA: Diagnosis not present

## 2016-08-26 DIAGNOSIS — R7309 Other abnormal glucose: Secondary | ICD-10-CM | POA: Diagnosis not present

## 2016-08-26 DIAGNOSIS — L659 Nonscarring hair loss, unspecified: Secondary | ICD-10-CM | POA: Diagnosis not present

## 2016-08-26 DIAGNOSIS — E785 Hyperlipidemia, unspecified: Secondary | ICD-10-CM | POA: Diagnosis not present

## 2016-08-26 DIAGNOSIS — M2550 Pain in unspecified joint: Secondary | ICD-10-CM | POA: Diagnosis not present

## 2016-08-31 DIAGNOSIS — M722 Plantar fascial fibromatosis: Secondary | ICD-10-CM | POA: Diagnosis not present

## 2016-09-08 ENCOUNTER — Institutional Professional Consult (permissible substitution): Payer: Medicare Other | Admitting: Internal Medicine

## 2016-09-24 DIAGNOSIS — Z87891 Personal history of nicotine dependence: Secondary | ICD-10-CM | POA: Diagnosis not present

## 2016-09-24 DIAGNOSIS — R091 Pleurisy: Secondary | ICD-10-CM | POA: Diagnosis not present

## 2016-09-24 DIAGNOSIS — K5900 Constipation, unspecified: Secondary | ICD-10-CM | POA: Diagnosis not present

## 2016-09-24 DIAGNOSIS — R0789 Other chest pain: Secondary | ICD-10-CM | POA: Diagnosis not present

## 2016-09-24 DIAGNOSIS — R1010 Upper abdominal pain, unspecified: Secondary | ICD-10-CM | POA: Diagnosis not present

## 2016-09-24 DIAGNOSIS — C3490 Malignant neoplasm of unspecified part of unspecified bronchus or lung: Secondary | ICD-10-CM | POA: Diagnosis not present

## 2016-09-24 DIAGNOSIS — K219 Gastro-esophageal reflux disease without esophagitis: Secondary | ICD-10-CM | POA: Diagnosis not present

## 2016-09-24 DIAGNOSIS — K2970 Gastritis, unspecified, without bleeding: Secondary | ICD-10-CM | POA: Diagnosis not present

## 2016-09-25 DIAGNOSIS — I493 Ventricular premature depolarization: Secondary | ICD-10-CM | POA: Diagnosis not present

## 2016-09-25 DIAGNOSIS — R001 Bradycardia, unspecified: Secondary | ICD-10-CM | POA: Diagnosis not present

## 2016-09-26 DIAGNOSIS — I493 Ventricular premature depolarization: Secondary | ICD-10-CM | POA: Diagnosis not present

## 2016-09-26 DIAGNOSIS — R001 Bradycardia, unspecified: Secondary | ICD-10-CM | POA: Diagnosis not present

## 2016-09-30 DIAGNOSIS — K5909 Other constipation: Secondary | ICD-10-CM | POA: Diagnosis not present

## 2016-09-30 DIAGNOSIS — R1031 Right lower quadrant pain: Secondary | ICD-10-CM | POA: Diagnosis not present

## 2016-10-04 ENCOUNTER — Institutional Professional Consult (permissible substitution): Payer: Medicare Other | Admitting: Internal Medicine

## 2016-10-07 DIAGNOSIS — R110 Nausea: Secondary | ICD-10-CM | POA: Diagnosis not present

## 2016-10-07 DIAGNOSIS — N1330 Unspecified hydronephrosis: Secondary | ICD-10-CM | POA: Diagnosis not present

## 2016-10-07 DIAGNOSIS — K8020 Calculus of gallbladder without cholecystitis without obstruction: Secondary | ICD-10-CM | POA: Diagnosis not present

## 2016-10-07 DIAGNOSIS — R1011 Right upper quadrant pain: Secondary | ICD-10-CM | POA: Diagnosis not present

## 2016-10-12 DIAGNOSIS — N1330 Unspecified hydronephrosis: Secondary | ICD-10-CM | POA: Diagnosis not present

## 2016-10-12 DIAGNOSIS — R3129 Other microscopic hematuria: Secondary | ICD-10-CM | POA: Diagnosis not present

## 2016-10-21 DIAGNOSIS — N1330 Unspecified hydronephrosis: Secondary | ICD-10-CM | POA: Diagnosis not present

## 2016-10-21 DIAGNOSIS — R3129 Other microscopic hematuria: Secondary | ICD-10-CM | POA: Diagnosis not present

## 2016-10-21 DIAGNOSIS — K5730 Diverticulosis of large intestine without perforation or abscess without bleeding: Secondary | ICD-10-CM | POA: Diagnosis not present

## 2016-11-05 DIAGNOSIS — J019 Acute sinusitis, unspecified: Secondary | ICD-10-CM | POA: Diagnosis not present

## 2016-11-12 DIAGNOSIS — R05 Cough: Secondary | ICD-10-CM | POA: Diagnosis not present

## 2016-11-16 DIAGNOSIS — J019 Acute sinusitis, unspecified: Secondary | ICD-10-CM | POA: Diagnosis not present

## 2016-12-20 DIAGNOSIS — M722 Plantar fascial fibromatosis: Secondary | ICD-10-CM | POA: Diagnosis not present

## 2016-12-21 DIAGNOSIS — M722 Plantar fascial fibromatosis: Secondary | ICD-10-CM | POA: Diagnosis not present

## 2016-12-23 DIAGNOSIS — M722 Plantar fascial fibromatosis: Secondary | ICD-10-CM | POA: Diagnosis not present

## 2016-12-27 DIAGNOSIS — M722 Plantar fascial fibromatosis: Secondary | ICD-10-CM | POA: Diagnosis not present

## 2016-12-29 DIAGNOSIS — M722 Plantar fascial fibromatosis: Secondary | ICD-10-CM | POA: Diagnosis not present

## 2016-12-30 DIAGNOSIS — M722 Plantar fascial fibromatosis: Secondary | ICD-10-CM | POA: Diagnosis not present

## 2017-01-03 DIAGNOSIS — M722 Plantar fascial fibromatosis: Secondary | ICD-10-CM | POA: Diagnosis not present

## 2017-01-04 ENCOUNTER — Telehealth: Payer: Self-pay | Admitting: Oncology

## 2017-01-04 NOTE — Telephone Encounter (Signed)
Per ROI  Mailed pt records to pt residence in [place]

## 2017-01-06 DIAGNOSIS — M722 Plantar fascial fibromatosis: Secondary | ICD-10-CM | POA: Diagnosis not present

## 2017-01-07 DIAGNOSIS — M722 Plantar fascial fibromatosis: Secondary | ICD-10-CM | POA: Diagnosis not present

## 2017-01-10 DIAGNOSIS — M722 Plantar fascial fibromatosis: Secondary | ICD-10-CM | POA: Diagnosis not present

## 2017-01-12 DIAGNOSIS — M722 Plantar fascial fibromatosis: Secondary | ICD-10-CM | POA: Diagnosis not present

## 2017-01-14 DIAGNOSIS — M722 Plantar fascial fibromatosis: Secondary | ICD-10-CM | POA: Diagnosis not present

## 2017-01-17 DIAGNOSIS — M722 Plantar fascial fibromatosis: Secondary | ICD-10-CM | POA: Diagnosis not present

## 2017-01-18 DIAGNOSIS — M722 Plantar fascial fibromatosis: Secondary | ICD-10-CM | POA: Diagnosis not present

## 2017-01-24 ENCOUNTER — Telehealth: Payer: Self-pay | Admitting: Oncology

## 2017-01-24 NOTE — Telephone Encounter (Signed)
Faxed pt records to Dr. Aura Camps in [place]

## 2017-01-31 DIAGNOSIS — Z760 Encounter for issue of repeat prescription: Secondary | ICD-10-CM | POA: Diagnosis not present

## 2017-01-31 DIAGNOSIS — K210 Gastro-esophageal reflux disease with esophagitis: Secondary | ICD-10-CM | POA: Diagnosis not present

## 2017-02-10 DIAGNOSIS — M722 Plantar fascial fibromatosis: Secondary | ICD-10-CM | POA: Diagnosis not present

## 2017-02-11 DIAGNOSIS — M4807 Spinal stenosis, lumbosacral region: Secondary | ICD-10-CM | POA: Diagnosis not present

## 2017-02-11 DIAGNOSIS — M722 Plantar fascial fibromatosis: Secondary | ICD-10-CM | POA: Diagnosis not present

## 2017-02-11 DIAGNOSIS — N951 Menopausal and female climacteric states: Secondary | ICD-10-CM | POA: Diagnosis not present

## 2017-02-11 DIAGNOSIS — M48062 Spinal stenosis, lumbar region with neurogenic claudication: Secondary | ICD-10-CM | POA: Diagnosis not present

## 2017-02-11 DIAGNOSIS — F411 Generalized anxiety disorder: Secondary | ICD-10-CM | POA: Diagnosis not present

## 2017-02-11 DIAGNOSIS — D010 Carcinoma in situ of colon: Secondary | ICD-10-CM | POA: Diagnosis not present

## 2017-02-11 DIAGNOSIS — K219 Gastro-esophageal reflux disease without esophagitis: Secondary | ICD-10-CM | POA: Diagnosis not present

## 2017-02-16 DIAGNOSIS — Z122 Encounter for screening for malignant neoplasm of respiratory organs: Secondary | ICD-10-CM | POA: Diagnosis not present

## 2017-02-16 DIAGNOSIS — Z7183 Encounter for nonprocreative genetic counseling: Secondary | ICD-10-CM | POA: Diagnosis not present

## 2017-02-16 DIAGNOSIS — C189 Malignant neoplasm of colon, unspecified: Secondary | ICD-10-CM | POA: Diagnosis not present

## 2017-02-16 DIAGNOSIS — K635 Polyp of colon: Secondary | ICD-10-CM | POA: Diagnosis not present

## 2017-02-17 DIAGNOSIS — M722 Plantar fascial fibromatosis: Secondary | ICD-10-CM | POA: Diagnosis not present

## 2017-02-18 ENCOUNTER — Telehealth: Payer: Self-pay | Admitting: *Deleted

## 2017-02-18 NOTE — Telephone Encounter (Signed)
Per staff message, confirmed with patient that she has moved to [place], and will not be at [facility].  Pt is requesting previous imaging be sent to [facility] in [facility].  Pt to call back with forwarding info, staff message sent to Dr. Gearldine Shown RN.

## 2017-02-21 ENCOUNTER — Ambulatory Visit: Payer: Medicare Other | Admitting: Oncology

## 2017-02-21 ENCOUNTER — Other Ambulatory Visit: Payer: Medicare Other

## 2017-02-24 DIAGNOSIS — R739 Hyperglycemia, unspecified: Secondary | ICD-10-CM | POA: Diagnosis not present

## 2017-02-24 DIAGNOSIS — N951 Menopausal and female climacteric states: Secondary | ICD-10-CM | POA: Diagnosis not present

## 2017-02-24 DIAGNOSIS — M48062 Spinal stenosis, lumbar region with neurogenic claudication: Secondary | ICD-10-CM | POA: Diagnosis not present

## 2017-02-24 DIAGNOSIS — M4807 Spinal stenosis, lumbosacral region: Secondary | ICD-10-CM | POA: Diagnosis not present

## 2017-02-24 DIAGNOSIS — M722 Plantar fascial fibromatosis: Secondary | ICD-10-CM | POA: Diagnosis not present

## 2017-02-24 DIAGNOSIS — K219 Gastro-esophageal reflux disease without esophagitis: Secondary | ICD-10-CM | POA: Diagnosis not present

## 2017-02-24 DIAGNOSIS — F411 Generalized anxiety disorder: Secondary | ICD-10-CM | POA: Diagnosis not present

## 2017-02-24 DIAGNOSIS — D010 Carcinoma in situ of colon: Secondary | ICD-10-CM | POA: Diagnosis not present

## 2017-03-07 DIAGNOSIS — Z01419 Encounter for gynecological examination (general) (routine) without abnormal findings: Secondary | ICD-10-CM | POA: Diagnosis not present

## 2017-03-07 DIAGNOSIS — Z124 Encounter for screening for malignant neoplasm of cervix: Secondary | ICD-10-CM | POA: Diagnosis not present

## 2017-03-07 DIAGNOSIS — N941 Unspecified dyspareunia: Secondary | ICD-10-CM | POA: Diagnosis not present

## 2017-03-08 DIAGNOSIS — N644 Mastodynia: Secondary | ICD-10-CM | POA: Diagnosis not present

## 2017-03-12 IMAGING — CT CT ABD-PELV W/ CM
2 of 5 series · 16 of 46 positions shown, 18 images · IV contrast (APPLIED)
Comparison: 08/13/2015

CLINICAL DATA: Right lower quadrant abdominal pain for 2 days.
Nausea.

EXAM:
CT ABDOMEN AND PELVIS WITH CONTRAST
TECHNIQUE: Multidetector CT imaging of the abdomen and pelvis was performed
using the standard protocol following bolus administration of
intravenous contrast.
CONTRAST:  100mL [id number] IOPAMIDOL (264V0O-G11) INJECTION 61%

[Series 2: axial st · axial · 0.93mm/px · z∈[-518,-143]mm · 13 of 85 slices shown, 15 images]
[im 5/85  soft-tissue]
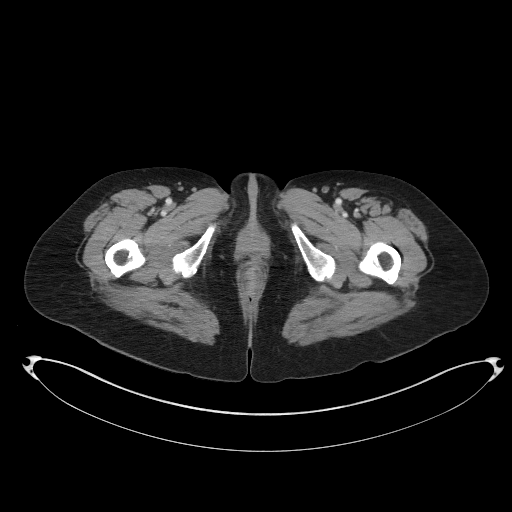
[im 5/85  bone]
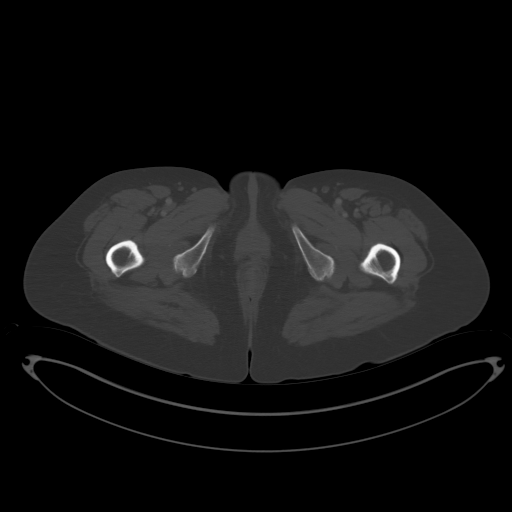
[im 14/85  soft-tissue]
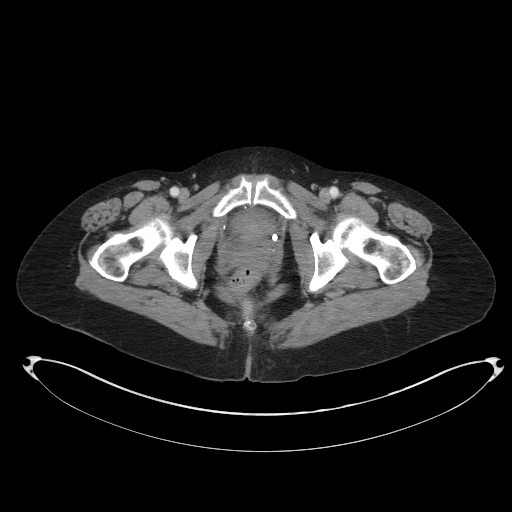
[im 18/85  soft-tissue]
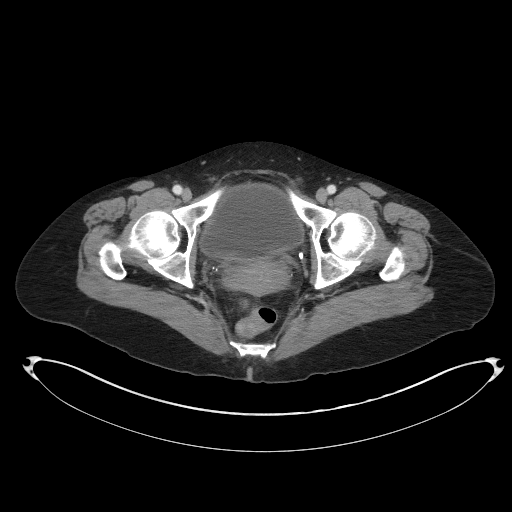
[im 23/85  soft-tissue]
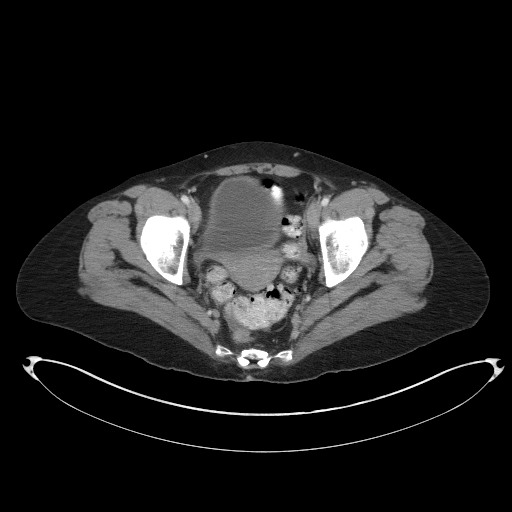
[im 31/85  soft-tissue]
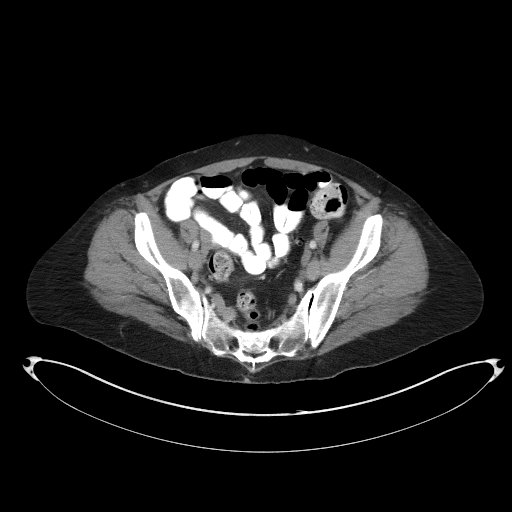
[im 36/85  soft-tissue]
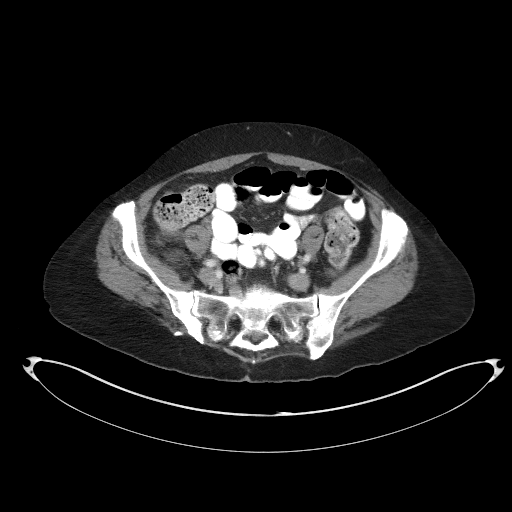
[im 45/85  soft-tissue]
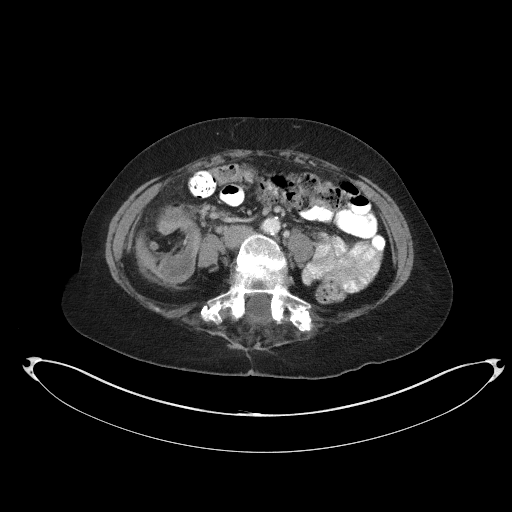
[im 49/85  soft-tissue]
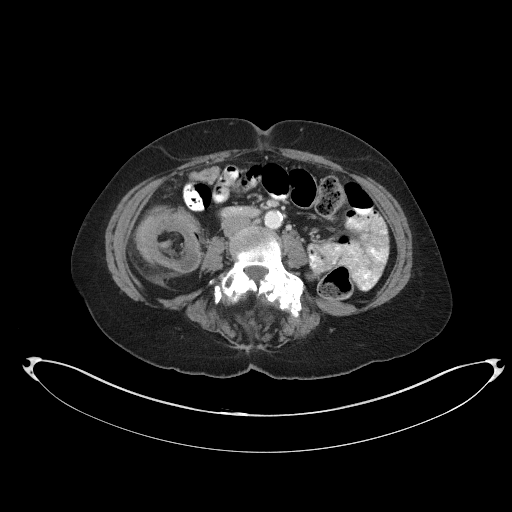
[im 54/85  soft-tissue]
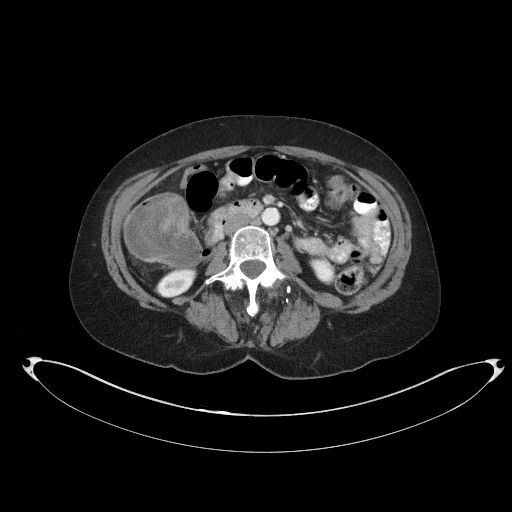
[im 54/85  bone]
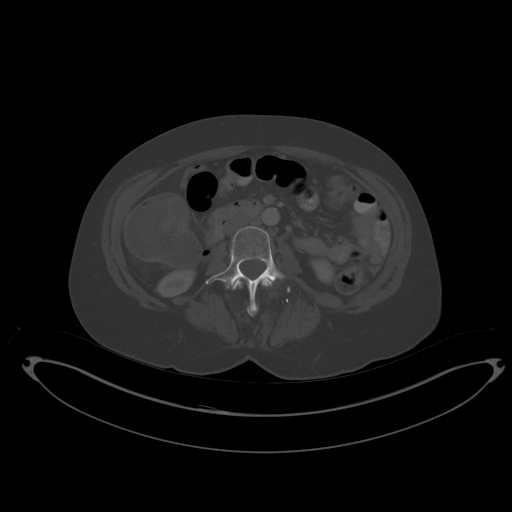
[im 62/85  soft-tissue]
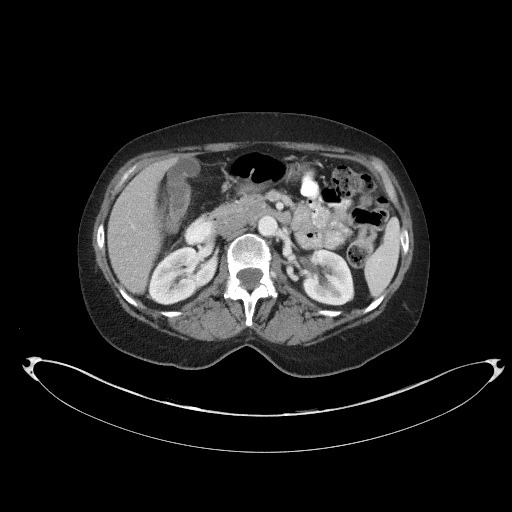
[im 67/85  soft-tissue]
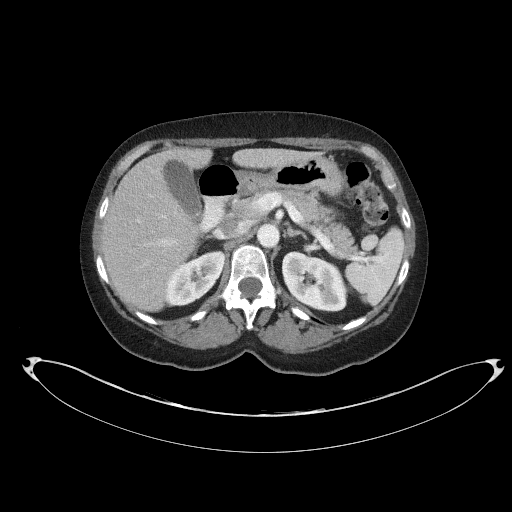
[im 71/85  soft-tissue]
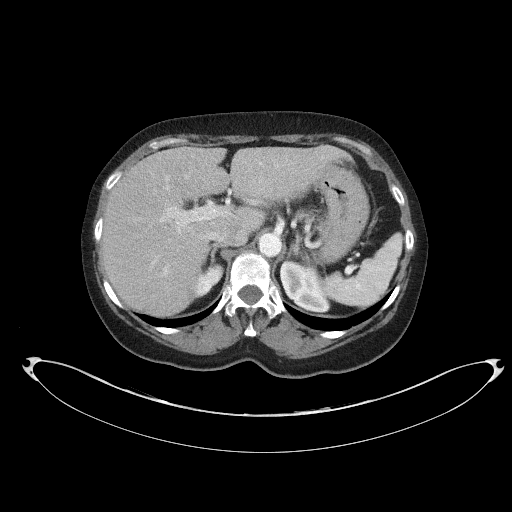
[im 80/85  soft-tissue]
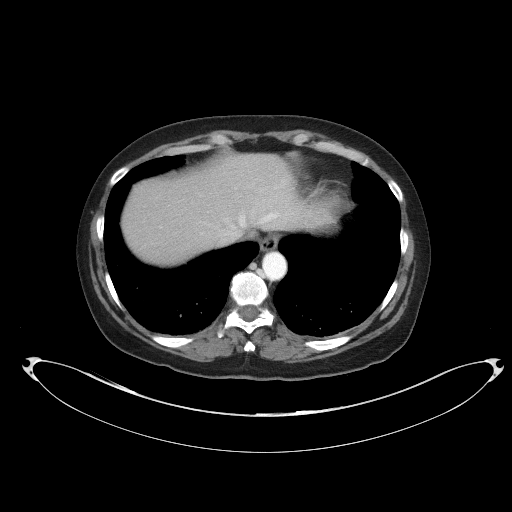

[Series 5: coronal st · coronal · 0.76mm/px · 3 of 80 slices shown]
[im 27/80  soft-tissue]
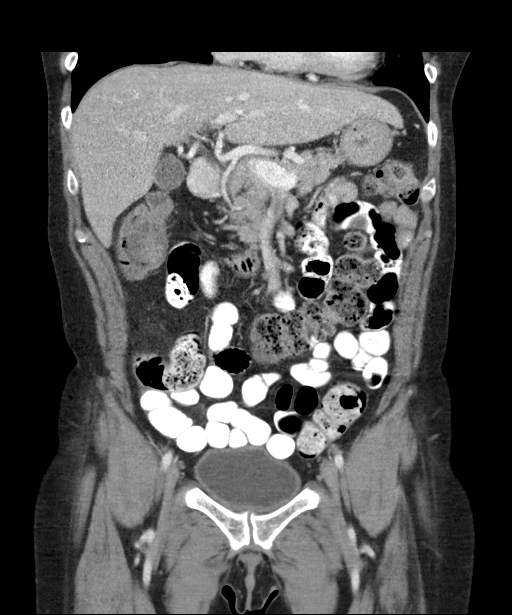
[im 36/80  soft-tissue]
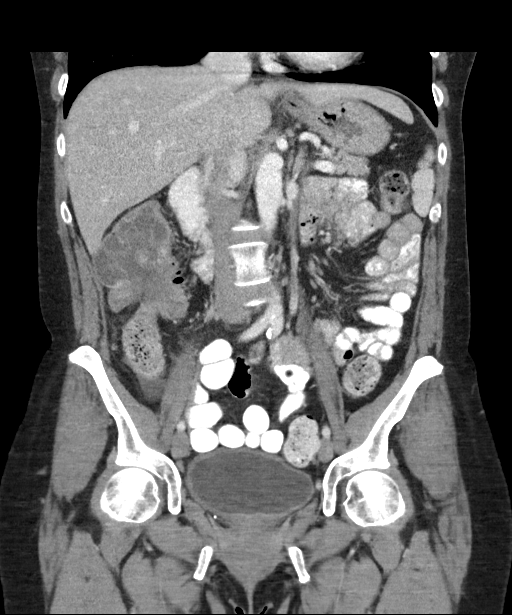
[im 44/80  soft-tissue]
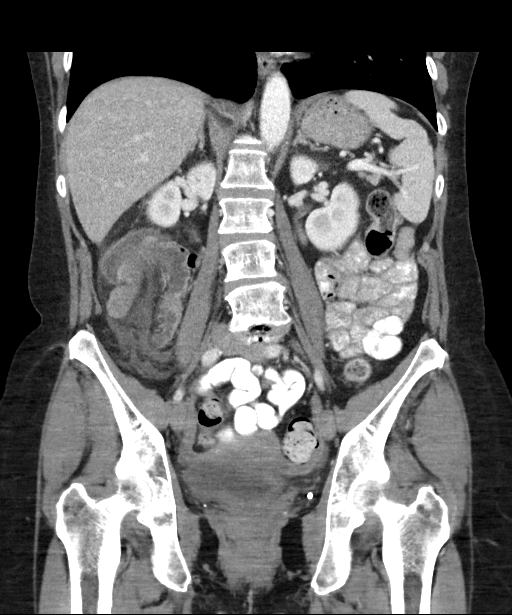

[16 of 46 positions shown; findings below may reference images not displayed]

FINDINGS: Lower chest: Dependent subsegmental atelectasis in both lower lobes.

Hepatobiliary: Unremarkable

Pancreas: Unremarkable

Spleen: Unremarkable

Adrenals/Urinary Tract: Unremarkable

Stomach/Bowel: Ileocolic intussusception with 7.8 cm excursion.
Interestingly, the appendix is incorporated into the intussusception
which also includes adipose tissue difficult to exclude wall
thickening in the cecum. Dilution of contrast in the distal ileum
which is mildly dilated. There is edema surrounding the
intussusception site. The appendix measures up to 8 mm in thickness,
mildly abnormally thickened.

Vascular/Lymphatic: Aortoiliac atherosclerotic vascular disease.

Reproductive: Unremarkable

Other: Small amount of pelvic ascites

Musculoskeletal: Posterior decompression at L4 and L5 with bilateral
pars defects at L4 and 12 mm anterolisthesis at L4-5.
IMPRESSION: 1. Ileocolic intussusception measuring about 7.8 cm in length, also
incorporating the slightly thickened appendix, with a small amount
of ascites surrounding the intussusceptions site and also down in
the pelvis. I am doubtful of acute appendicitis although given the
slight wall thickening of the appendix, appendicitis is not totally
excluded. Intussusception and adult is frequently accompanied by an
underlying lesion, and it may be prudent to screen the colon after a
prep per reduction, particularly the cecum, to ensure that there is
not an underlying occult mass.
2. Bilateral pars defects at L4 with 12 mm of anterolisthesis, but
no impingement flanks to the posterior decompressive surgery.
3.  Aortoiliac atherosclerotic vascular disease.

## 2017-03-13 IMAGING — DX DG CHEST 2V
2 series · 2 of 2 positions shown · non-contrast
Comparison: 01/24/2015

CLINICAL DATA: Preoperative evaluation for upcoming collected,
cough

EXAM:
CHEST  2 VIEW

[chest lat]
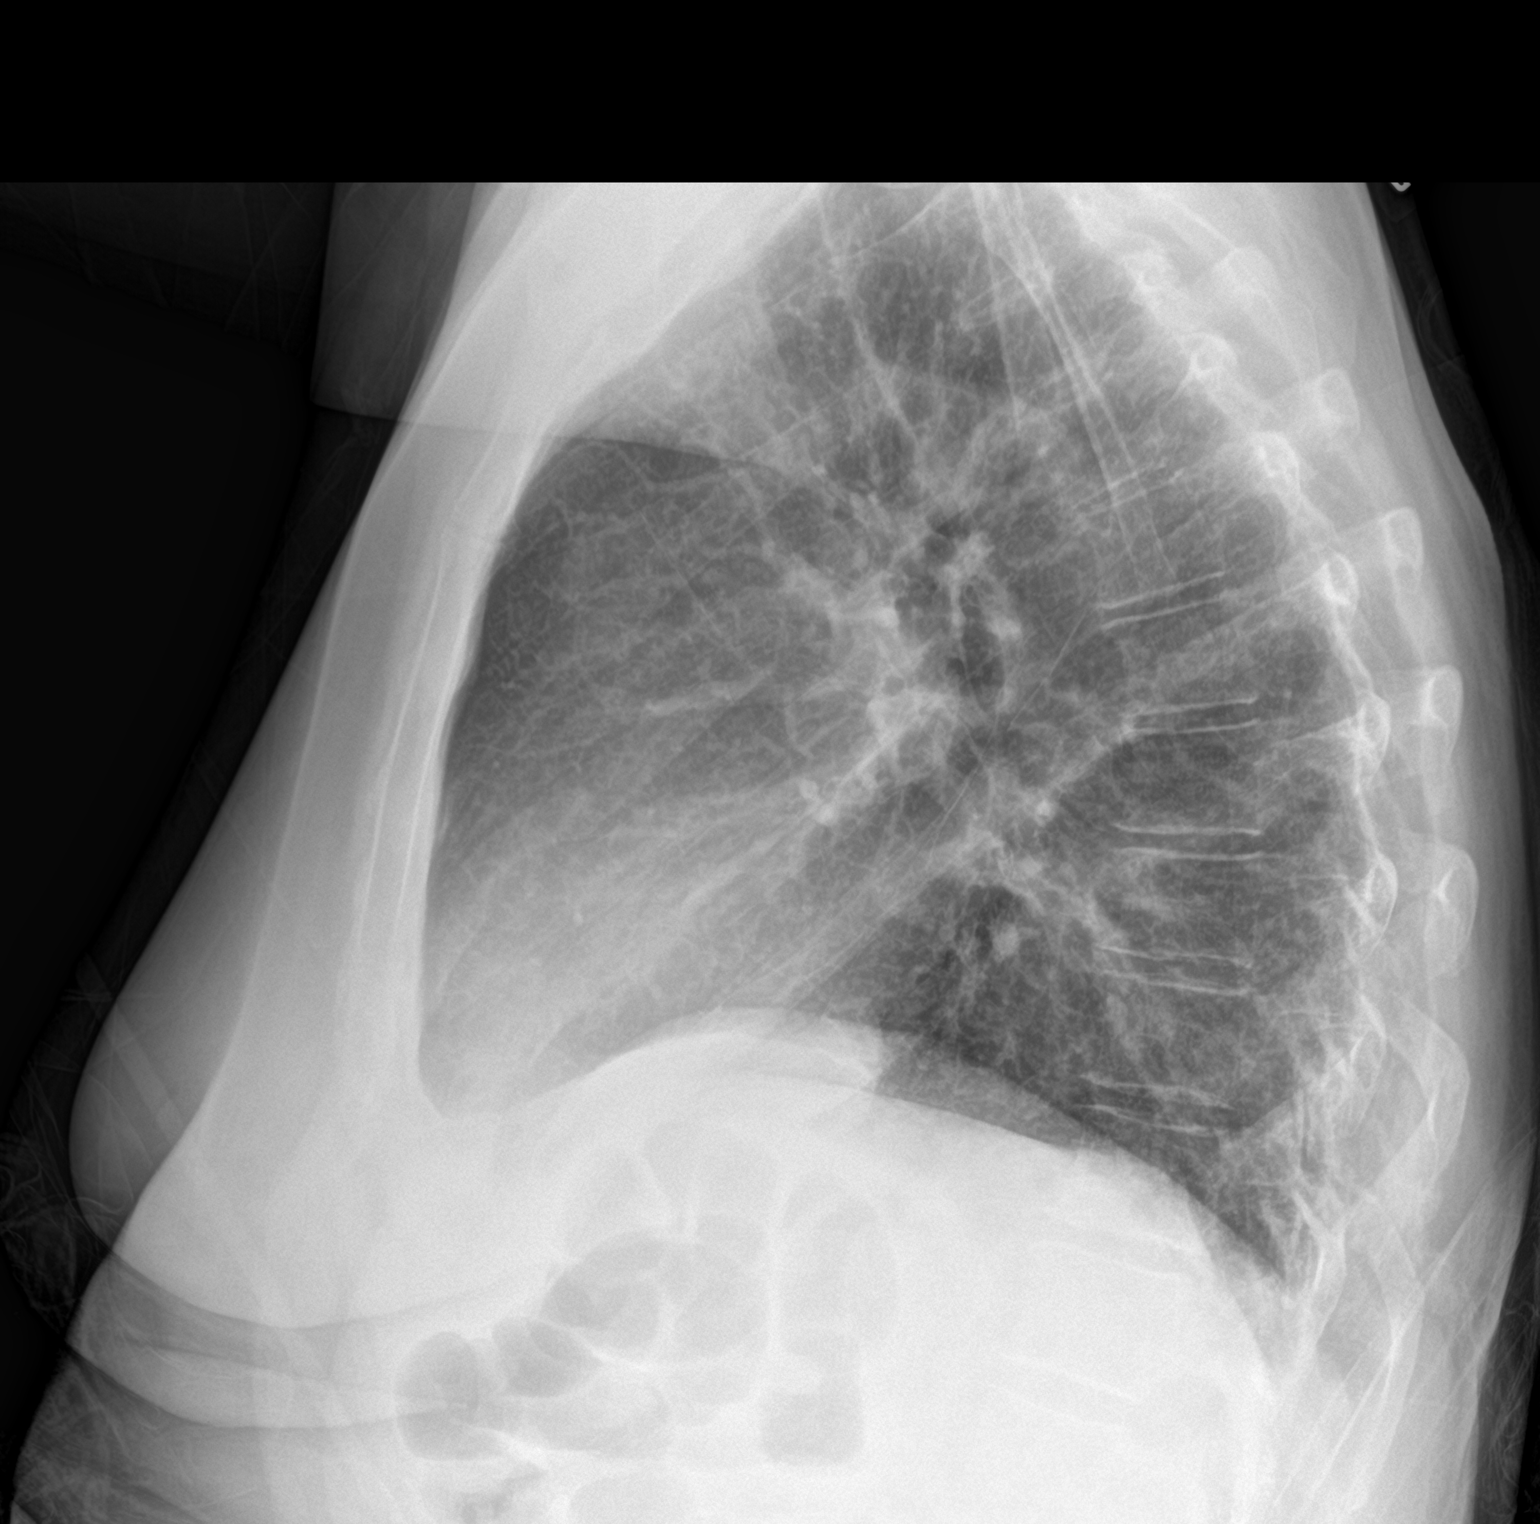

[chest ap]
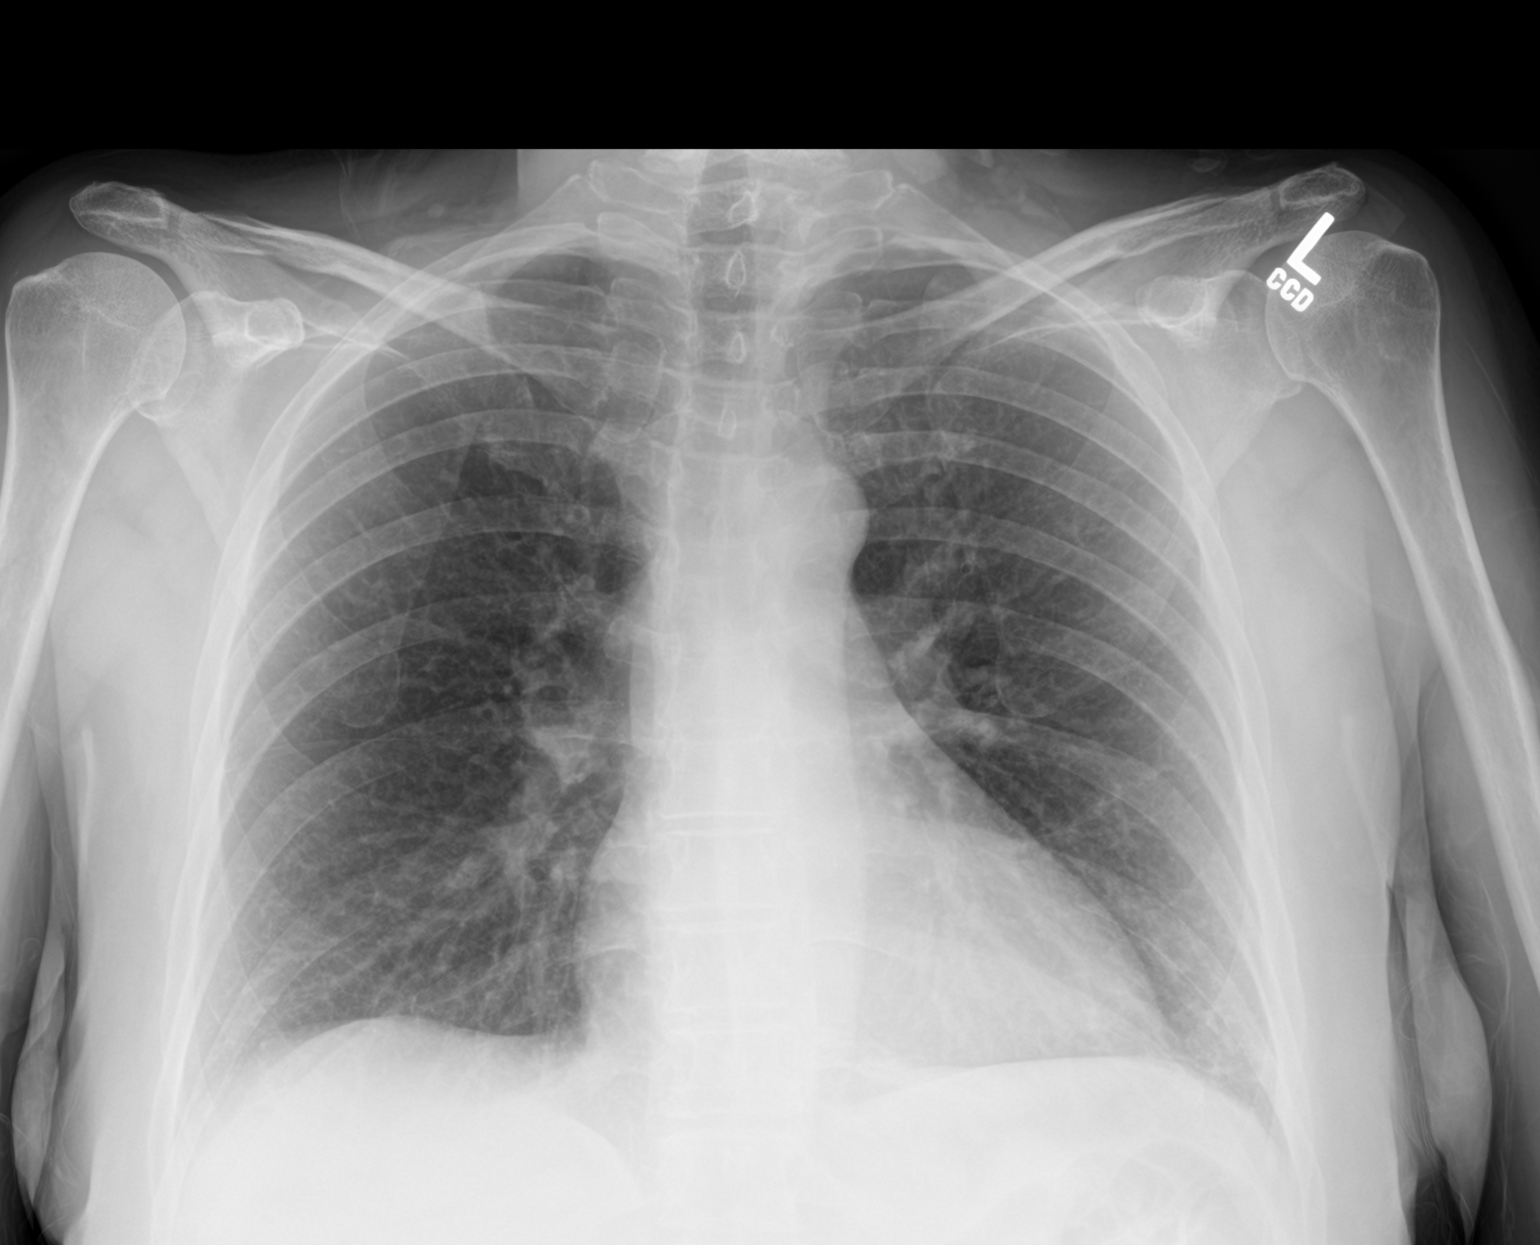

[2 of 2 positions shown; findings below may reference images not displayed]

FINDINGS: Cardiac shadow is within normal limits. The lungs are well aerated
bilaterally. No focal infiltrate or sizable effusion is seen. The
osseous structures are within normal limits.
IMPRESSION: No active cardiopulmonary disease.

## 2017-03-19 DIAGNOSIS — L812 Freckles: Secondary | ICD-10-CM | POA: Diagnosis not present

## 2017-03-19 DIAGNOSIS — L814 Other melanin hyperpigmentation: Secondary | ICD-10-CM | POA: Diagnosis not present

## 2017-03-19 DIAGNOSIS — L821 Other seborrheic keratosis: Secondary | ICD-10-CM | POA: Diagnosis not present

## 2017-04-01 DIAGNOSIS — J432 Centrilobular emphysema: Secondary | ICD-10-CM | POA: Diagnosis not present

## 2017-04-01 DIAGNOSIS — Z7183 Encounter for nonprocreative genetic counseling: Secondary | ICD-10-CM | POA: Diagnosis not present

## 2017-04-01 DIAGNOSIS — C189 Malignant neoplasm of colon, unspecified: Secondary | ICD-10-CM | POA: Diagnosis not present

## 2017-04-06 DIAGNOSIS — J432 Centrilobular emphysema: Secondary | ICD-10-CM | POA: Diagnosis not present

## 2017-04-06 DIAGNOSIS — Z08 Encounter for follow-up examination after completed treatment for malignant neoplasm: Secondary | ICD-10-CM | POA: Diagnosis not present

## 2017-04-06 DIAGNOSIS — C189 Malignant neoplasm of colon, unspecified: Secondary | ICD-10-CM | POA: Diagnosis not present

## 2017-04-06 DIAGNOSIS — K635 Polyp of colon: Secondary | ICD-10-CM | POA: Diagnosis not present

## 2017-04-14 DIAGNOSIS — D010 Carcinoma in situ of colon: Secondary | ICD-10-CM | POA: Diagnosis not present

## 2017-04-14 DIAGNOSIS — F411 Generalized anxiety disorder: Secondary | ICD-10-CM | POA: Diagnosis not present

## 2017-04-14 DIAGNOSIS — M25541 Pain in joints of right hand: Secondary | ICD-10-CM | POA: Diagnosis not present

## 2017-04-14 DIAGNOSIS — N951 Menopausal and female climacteric states: Secondary | ICD-10-CM | POA: Diagnosis not present

## 2017-04-14 DIAGNOSIS — R739 Hyperglycemia, unspecified: Secondary | ICD-10-CM | POA: Diagnosis not present

## 2017-04-14 DIAGNOSIS — M48062 Spinal stenosis, lumbar region with neurogenic claudication: Secondary | ICD-10-CM | POA: Diagnosis not present

## 2017-04-14 DIAGNOSIS — M722 Plantar fascial fibromatosis: Secondary | ICD-10-CM | POA: Diagnosis not present

## 2017-04-14 DIAGNOSIS — R5383 Other fatigue: Secondary | ICD-10-CM | POA: Diagnosis not present

## 2017-04-14 DIAGNOSIS — L650 Telogen effluvium: Secondary | ICD-10-CM | POA: Diagnosis not present

## 2017-04-14 DIAGNOSIS — M4807 Spinal stenosis, lumbosacral region: Secondary | ICD-10-CM | POA: Diagnosis not present

## 2017-04-14 DIAGNOSIS — K219 Gastro-esophageal reflux disease without esophagitis: Secondary | ICD-10-CM | POA: Diagnosis not present

## 2017-04-14 DIAGNOSIS — R42 Dizziness and giddiness: Secondary | ICD-10-CM | POA: Diagnosis not present

## 2017-04-19 DIAGNOSIS — L719 Rosacea, unspecified: Secondary | ICD-10-CM | POA: Diagnosis not present

## 2017-04-19 DIAGNOSIS — L298 Other pruritus: Secondary | ICD-10-CM | POA: Diagnosis not present

## 2017-04-19 DIAGNOSIS — R21 Rash and other nonspecific skin eruption: Secondary | ICD-10-CM | POA: Diagnosis not present

## 2017-04-29 DIAGNOSIS — M48062 Spinal stenosis, lumbar region with neurogenic claudication: Secondary | ICD-10-CM | POA: Diagnosis not present

## 2017-04-29 DIAGNOSIS — M25541 Pain in joints of right hand: Secondary | ICD-10-CM | POA: Diagnosis not present

## 2017-04-29 DIAGNOSIS — M722 Plantar fascial fibromatosis: Secondary | ICD-10-CM | POA: Diagnosis not present

## 2017-04-29 DIAGNOSIS — R739 Hyperglycemia, unspecified: Secondary | ICD-10-CM | POA: Diagnosis not present

## 2017-04-29 DIAGNOSIS — L650 Telogen effluvium: Secondary | ICD-10-CM | POA: Diagnosis not present

## 2017-04-29 DIAGNOSIS — R42 Dizziness and giddiness: Secondary | ICD-10-CM | POA: Diagnosis not present

## 2017-04-29 DIAGNOSIS — E6609 Other obesity due to excess calories: Secondary | ICD-10-CM | POA: Diagnosis not present

## 2017-04-29 DIAGNOSIS — N951 Menopausal and female climacteric states: Secondary | ICD-10-CM | POA: Diagnosis not present

## 2017-04-29 DIAGNOSIS — D010 Carcinoma in situ of colon: Secondary | ICD-10-CM | POA: Diagnosis not present

## 2017-04-29 DIAGNOSIS — K219 Gastro-esophageal reflux disease without esophagitis: Secondary | ICD-10-CM | POA: Diagnosis not present

## 2017-04-29 DIAGNOSIS — F411 Generalized anxiety disorder: Secondary | ICD-10-CM | POA: Diagnosis not present

## 2017-04-29 DIAGNOSIS — M4807 Spinal stenosis, lumbosacral region: Secondary | ICD-10-CM | POA: Diagnosis not present

## 2017-05-03 DIAGNOSIS — L719 Rosacea, unspecified: Secondary | ICD-10-CM | POA: Diagnosis not present

## 2017-05-03 DIAGNOSIS — L298 Other pruritus: Secondary | ICD-10-CM | POA: Diagnosis not present

## 2017-05-27 DIAGNOSIS — R739 Hyperglycemia, unspecified: Secondary | ICD-10-CM | POA: Diagnosis not present

## 2017-05-27 DIAGNOSIS — E6609 Other obesity due to excess calories: Secondary | ICD-10-CM | POA: Diagnosis not present

## 2017-05-27 DIAGNOSIS — M722 Plantar fascial fibromatosis: Secondary | ICD-10-CM | POA: Diagnosis not present

## 2017-05-27 DIAGNOSIS — K219 Gastro-esophageal reflux disease without esophagitis: Secondary | ICD-10-CM | POA: Diagnosis not present

## 2017-05-27 DIAGNOSIS — M48062 Spinal stenosis, lumbar region with neurogenic claudication: Secondary | ICD-10-CM | POA: Diagnosis not present

## 2017-05-27 DIAGNOSIS — F411 Generalized anxiety disorder: Secondary | ICD-10-CM | POA: Diagnosis not present

## 2017-05-27 DIAGNOSIS — M4807 Spinal stenosis, lumbosacral region: Secondary | ICD-10-CM | POA: Diagnosis not present

## 2017-05-27 DIAGNOSIS — R42 Dizziness and giddiness: Secondary | ICD-10-CM | POA: Diagnosis not present

## 2017-05-27 DIAGNOSIS — L650 Telogen effluvium: Secondary | ICD-10-CM | POA: Diagnosis not present

## 2017-05-27 DIAGNOSIS — N951 Menopausal and female climacteric states: Secondary | ICD-10-CM | POA: Diagnosis not present

## 2017-05-27 DIAGNOSIS — M25541 Pain in joints of right hand: Secondary | ICD-10-CM | POA: Diagnosis not present

## 2017-05-27 DIAGNOSIS — D010 Carcinoma in situ of colon: Secondary | ICD-10-CM | POA: Diagnosis not present

## 2017-07-04 DIAGNOSIS — M2550 Pain in unspecified joint: Secondary | ICD-10-CM | POA: Diagnosis not present

## 2017-07-04 DIAGNOSIS — M791 Myalgia: Secondary | ICD-10-CM | POA: Diagnosis not present

## 2017-07-13 DIAGNOSIS — L659 Nonscarring hair loss, unspecified: Secondary | ICD-10-CM | POA: Diagnosis not present

## 2017-07-13 DIAGNOSIS — L821 Other seborrheic keratosis: Secondary | ICD-10-CM | POA: Diagnosis not present

## 2017-07-13 DIAGNOSIS — L718 Other rosacea: Secondary | ICD-10-CM | POA: Diagnosis not present

## 2017-08-02 DIAGNOSIS — C189 Malignant neoplasm of colon, unspecified: Secondary | ICD-10-CM | POA: Diagnosis not present

## 2017-08-02 DIAGNOSIS — K5900 Constipation, unspecified: Secondary | ICD-10-CM | POA: Diagnosis not present

## 2017-08-02 DIAGNOSIS — K8020 Calculus of gallbladder without cholecystitis without obstruction: Secondary | ICD-10-CM | POA: Diagnosis not present

## 2017-08-02 DIAGNOSIS — L659 Nonscarring hair loss, unspecified: Secondary | ICD-10-CM | POA: Diagnosis not present

## 2017-08-08 DIAGNOSIS — R1013 Epigastric pain: Secondary | ICD-10-CM | POA: Diagnosis not present

## 2017-08-08 DIAGNOSIS — G4733 Obstructive sleep apnea (adult) (pediatric): Secondary | ICD-10-CM | POA: Diagnosis not present

## 2017-08-08 DIAGNOSIS — K640 First degree hemorrhoids: Secondary | ICD-10-CM | POA: Diagnosis not present

## 2017-08-08 DIAGNOSIS — K5730 Diverticulosis of large intestine without perforation or abscess without bleeding: Secondary | ICD-10-CM | POA: Diagnosis not present

## 2017-08-08 DIAGNOSIS — Z86010 Personal history of colonic polyps: Secondary | ICD-10-CM | POA: Diagnosis not present

## 2017-08-08 DIAGNOSIS — Z5181 Encounter for therapeutic drug level monitoring: Secondary | ICD-10-CM | POA: Diagnosis not present

## 2017-08-08 DIAGNOSIS — D125 Benign neoplasm of sigmoid colon: Secondary | ICD-10-CM | POA: Diagnosis not present

## 2017-09-01 DIAGNOSIS — Z1159 Encounter for screening for other viral diseases: Secondary | ICD-10-CM | POA: Diagnosis not present

## 2017-09-01 DIAGNOSIS — R5382 Chronic fatigue, unspecified: Secondary | ICD-10-CM | POA: Diagnosis not present

## 2017-09-01 DIAGNOSIS — R42 Dizziness and giddiness: Secondary | ICD-10-CM | POA: Diagnosis not present

## 2017-09-01 DIAGNOSIS — Z6828 Body mass index (BMI) 28.0-28.9, adult: Secondary | ICD-10-CM | POA: Diagnosis not present

## 2017-09-01 DIAGNOSIS — K219 Gastro-esophageal reflux disease without esophagitis: Secondary | ICD-10-CM | POA: Diagnosis not present

## 2017-09-01 DIAGNOSIS — Z780 Asymptomatic menopausal state: Secondary | ICD-10-CM | POA: Diagnosis not present

## 2017-09-01 DIAGNOSIS — H9313 Tinnitus, bilateral: Secondary | ICD-10-CM | POA: Diagnosis not present

## 2017-09-01 DIAGNOSIS — E039 Hypothyroidism, unspecified: Secondary | ICD-10-CM | POA: Diagnosis not present

## 2017-09-01 DIAGNOSIS — M25541 Pain in joints of right hand: Secondary | ICD-10-CM | POA: Diagnosis not present

## 2017-09-14 DIAGNOSIS — Z6828 Body mass index (BMI) 28.0-28.9, adult: Secondary | ICD-10-CM | POA: Diagnosis not present

## 2017-09-14 DIAGNOSIS — E559 Vitamin D deficiency, unspecified: Secondary | ICD-10-CM | POA: Diagnosis not present

## 2017-09-14 DIAGNOSIS — E538 Deficiency of other specified B group vitamins: Secondary | ICD-10-CM | POA: Diagnosis not present

## 2017-09-14 DIAGNOSIS — F418 Other specified anxiety disorders: Secondary | ICD-10-CM | POA: Diagnosis not present

## 2017-09-14 DIAGNOSIS — E039 Hypothyroidism, unspecified: Secondary | ICD-10-CM | POA: Diagnosis not present

## 2017-09-14 DIAGNOSIS — Z79899 Other long term (current) drug therapy: Secondary | ICD-10-CM | POA: Diagnosis not present

## 2017-09-27 DIAGNOSIS — E039 Hypothyroidism, unspecified: Secondary | ICD-10-CM | POA: Diagnosis not present

## 2017-09-27 DIAGNOSIS — E559 Vitamin D deficiency, unspecified: Secondary | ICD-10-CM | POA: Diagnosis not present

## 2017-09-27 DIAGNOSIS — F418 Other specified anxiety disorders: Secondary | ICD-10-CM | POA: Diagnosis not present

## 2017-09-27 DIAGNOSIS — R944 Abnormal results of kidney function studies: Secondary | ICD-10-CM | POA: Diagnosis not present

## 2017-09-27 DIAGNOSIS — Z6828 Body mass index (BMI) 28.0-28.9, adult: Secondary | ICD-10-CM | POA: Diagnosis not present

## 2017-09-27 DIAGNOSIS — R42 Dizziness and giddiness: Secondary | ICD-10-CM | POA: Diagnosis not present

## 2017-09-27 DIAGNOSIS — E538 Deficiency of other specified B group vitamins: Secondary | ICD-10-CM | POA: Diagnosis not present

## 2017-09-30 DIAGNOSIS — R51 Headache: Secondary | ICD-10-CM | POA: Diagnosis not present

## 2017-09-30 DIAGNOSIS — R42 Dizziness and giddiness: Secondary | ICD-10-CM | POA: Diagnosis not present

## 2017-10-05 DIAGNOSIS — J0110 Acute frontal sinusitis, unspecified: Secondary | ICD-10-CM | POA: Diagnosis not present

## 2017-10-05 DIAGNOSIS — F418 Other specified anxiety disorders: Secondary | ICD-10-CM | POA: Diagnosis not present

## 2017-10-05 DIAGNOSIS — Z6828 Body mass index (BMI) 28.0-28.9, adult: Secondary | ICD-10-CM | POA: Diagnosis not present

## 2017-10-05 DIAGNOSIS — G44229 Chronic tension-type headache, not intractable: Secondary | ICD-10-CM | POA: Diagnosis not present

## 2017-10-05 DIAGNOSIS — E538 Deficiency of other specified B group vitamins: Secondary | ICD-10-CM | POA: Diagnosis not present

## 2017-11-11 DIAGNOSIS — K8020 Calculus of gallbladder without cholecystitis without obstruction: Secondary | ICD-10-CM | POA: Diagnosis not present

## 2017-11-11 DIAGNOSIS — C189 Malignant neoplasm of colon, unspecified: Secondary | ICD-10-CM | POA: Diagnosis not present

## 2017-11-16 DIAGNOSIS — Z6828 Body mass index (BMI) 28.0-28.9, adult: Secondary | ICD-10-CM | POA: Diagnosis not present

## 2017-11-16 DIAGNOSIS — C189 Malignant neoplasm of colon, unspecified: Secondary | ICD-10-CM | POA: Diagnosis not present

## 2017-11-16 DIAGNOSIS — J432 Centrilobular emphysema: Secondary | ICD-10-CM | POA: Diagnosis not present

## 2017-11-16 DIAGNOSIS — R911 Solitary pulmonary nodule: Secondary | ICD-10-CM | POA: Diagnosis not present

## 2017-11-16 DIAGNOSIS — Z08 Encounter for follow-up examination after completed treatment for malignant neoplasm: Secondary | ICD-10-CM | POA: Diagnosis not present

## 2017-12-06 DIAGNOSIS — E039 Hypothyroidism, unspecified: Secondary | ICD-10-CM | POA: Diagnosis not present

## 2017-12-06 DIAGNOSIS — R42 Dizziness and giddiness: Secondary | ICD-10-CM | POA: Diagnosis not present

## 2017-12-06 DIAGNOSIS — F419 Anxiety disorder, unspecified: Secondary | ICD-10-CM | POA: Diagnosis not present

## 2017-12-06 DIAGNOSIS — Z6829 Body mass index (BMI) 29.0-29.9, adult: Secondary | ICD-10-CM | POA: Diagnosis not present

## 2017-12-06 DIAGNOSIS — E538 Deficiency of other specified B group vitamins: Secondary | ICD-10-CM | POA: Diagnosis not present

## 2017-12-06 DIAGNOSIS — E559 Vitamin D deficiency, unspecified: Secondary | ICD-10-CM | POA: Diagnosis not present

## 2017-12-06 DIAGNOSIS — R5382 Chronic fatigue, unspecified: Secondary | ICD-10-CM | POA: Diagnosis not present

## 2017-12-09 DIAGNOSIS — J439 Emphysema, unspecified: Secondary | ICD-10-CM | POA: Diagnosis not present

## 2017-12-09 DIAGNOSIS — R9389 Abnormal findings on diagnostic imaging of other specified body structures: Secondary | ICD-10-CM | POA: Diagnosis not present

## 2017-12-09 DIAGNOSIS — Z85038 Personal history of other malignant neoplasm of large intestine: Secondary | ICD-10-CM | POA: Diagnosis not present

## 2017-12-09 DIAGNOSIS — Z6828 Body mass index (BMI) 28.0-28.9, adult: Secondary | ICD-10-CM | POA: Diagnosis not present

## 2017-12-15 DIAGNOSIS — E039 Hypothyroidism, unspecified: Secondary | ICD-10-CM | POA: Diagnosis not present

## 2017-12-15 DIAGNOSIS — R51 Headache: Secondary | ICD-10-CM | POA: Diagnosis not present

## 2017-12-15 DIAGNOSIS — E559 Vitamin D deficiency, unspecified: Secondary | ICD-10-CM | POA: Diagnosis not present

## 2017-12-15 DIAGNOSIS — E538 Deficiency of other specified B group vitamins: Secondary | ICD-10-CM | POA: Diagnosis not present

## 2017-12-15 DIAGNOSIS — F418 Other specified anxiety disorders: Secondary | ICD-10-CM | POA: Diagnosis not present

## 2017-12-15 DIAGNOSIS — R0602 Shortness of breath: Secondary | ICD-10-CM | POA: Diagnosis not present

## 2017-12-15 DIAGNOSIS — Z6829 Body mass index (BMI) 29.0-29.9, adult: Secondary | ICD-10-CM | POA: Diagnosis not present

## 2017-12-15 DIAGNOSIS — Z780 Asymptomatic menopausal state: Secondary | ICD-10-CM | POA: Diagnosis not present

## 2018-01-05 DIAGNOSIS — R51 Headache: Secondary | ICD-10-CM | POA: Diagnosis not present

## 2018-01-05 DIAGNOSIS — E039 Hypothyroidism, unspecified: Secondary | ICD-10-CM | POA: Diagnosis not present

## 2018-01-05 DIAGNOSIS — F418 Other specified anxiety disorders: Secondary | ICD-10-CM | POA: Diagnosis not present

## 2018-01-05 DIAGNOSIS — R5382 Chronic fatigue, unspecified: Secondary | ICD-10-CM | POA: Diagnosis not present

## 2018-01-05 DIAGNOSIS — E538 Deficiency of other specified B group vitamins: Secondary | ICD-10-CM | POA: Diagnosis not present

## 2018-01-05 DIAGNOSIS — R7303 Prediabetes: Secondary | ICD-10-CM | POA: Diagnosis not present

## 2018-01-05 DIAGNOSIS — E041 Nontoxic single thyroid nodule: Secondary | ICD-10-CM | POA: Diagnosis not present

## 2018-01-05 DIAGNOSIS — Z6829 Body mass index (BMI) 29.0-29.9, adult: Secondary | ICD-10-CM | POA: Diagnosis not present
